# Patient Record
Sex: Female | Born: 1950 | Hispanic: Yes | Smoking: Former smoker
Health system: Southern US, Community
[De-identification: ages and names within clinical notes are randomized; demographics above are authoritative.]

## PROBLEM LIST (undated history)

## (undated) DIAGNOSIS — Z95810 Presence of automatic (implantable) cardiac defibrillator: Secondary | ICD-10-CM

## (undated) DIAGNOSIS — G4733 Obstructive sleep apnea (adult) (pediatric): Secondary | ICD-10-CM

## (undated) DIAGNOSIS — I10 Essential (primary) hypertension: Secondary | ICD-10-CM

## (undated) DIAGNOSIS — J449 Chronic obstructive pulmonary disease, unspecified: Secondary | ICD-10-CM

## (undated) DIAGNOSIS — I509 Heart failure, unspecified: Secondary | ICD-10-CM

## (undated) DIAGNOSIS — C801 Malignant (primary) neoplasm, unspecified: Secondary | ICD-10-CM

## (undated) DIAGNOSIS — Z950 Presence of cardiac pacemaker: Secondary | ICD-10-CM

## (undated) HISTORY — PX: ABDOMINAL HYSTERECTOMY: SHX81

## (undated) HISTORY — PX: TONSILLECTOMY: SUR1361

## (undated) HISTORY — PX: JOINT REPLACEMENT: SHX530

---

## 2011-09-08 DIAGNOSIS — C50919 Malignant neoplasm of unspecified site of unspecified female breast: Secondary | ICD-10-CM | POA: Diagnosis present

## 2011-10-25 ENCOUNTER — Ambulatory Visit: Payer: Self-pay

## 2012-08-17 DIAGNOSIS — E669 Obesity, unspecified: Secondary | ICD-10-CM | POA: Diagnosis present

## 2014-03-12 DIAGNOSIS — Z95810 Presence of automatic (implantable) cardiac defibrillator: Secondary | ICD-10-CM | POA: Diagnosis present

## 2017-07-22 ENCOUNTER — Encounter: Payer: Self-pay | Admitting: Emergency Medicine

## 2017-07-22 ENCOUNTER — Ambulatory Visit
Admission: EM | Admit: 2017-07-22 | Discharge: 2017-07-22 | Disposition: A | Discharge status: Home / Self Care | Payer: Medicare HMO | Attending: Family Medicine | Admitting: Family Medicine

## 2017-07-22 DIAGNOSIS — M5432 Sciatica, left side: Secondary | ICD-10-CM | POA: Diagnosis not present

## 2017-07-22 HISTORY — DX: Malignant (primary) neoplasm, unspecified: C80.1

## 2017-07-22 HISTORY — DX: Essential (primary) hypertension: I10

## 2017-07-22 HISTORY — DX: Presence of cardiac pacemaker: Z95.0

## 2017-07-22 HISTORY — DX: Presence of automatic (implantable) cardiac defibrillator: Z95.810

## 2017-07-22 MED ORDER — PREDNISONE 20 MG PO TABS: ORAL_TABLET | ORAL | 0 refills | Status: DC

## 2017-07-22 NOTE — ED Provider Notes (Signed)
[facility]    CSN: [id number] Arrival date & time: 07/22/17  1246     History   Chief Complaint Chief Complaint  Patient presents with  . Leg Pain    HPI Sophia Andrews is a 66 y.o. female.    Leg Pain  Location:  Buttock Time since incident:  3 weeks Injury: no   Buttock location:  L buttock Pain details:    Quality:  Aching   Radiates to:  L leg Chronicity:  Recurrent Dislocation: no   Foreign body present:  No foreign bodies Prior injury to area:  No Relieved by:  Nothing Ineffective treatments:  NSAIDs Associated symptoms: back pain (left lower back )   Associated symptoms: no decreased ROM, no fatigue, no fever, no itching, no muscle weakness, no neck pain, no numbness, no stiffness, no swelling and no tingling   Back Pain  Location:  Lumbar spine Quality:  Aching Radiates to:  L posterior upper leg Pain severity:  Moderate Pain is:  Same all the time Onset quality:  Sudden Duration:  3 weeks Timing:  Constant Progression:  Unchanged Chronicity:  Recurrent Context: not emotional stress, not falling, not jumping from heights, not lifting heavy objects, not MCA, not MVA, not occupational injury, not pedestrian accident, not physical stress, not recent illness, not recent injury and not twisting   Relieved by:  Nothing Ineffective treatments:  NSAIDs and narcotics Associated symptoms: leg pain   Associated symptoms: no abdominal pain, no abdominal swelling, no bladder incontinence, no bowel incontinence, no chest pain, no dysuria, no fever, no headaches, no numbness, no paresthesias, no pelvic pain, no perianal numbness, no tingling, no weakness and no weight loss   Risk factors: hx of cancer   Risk factors: not pregnant, no recent surgery and no steroid use     Past Medical History:  Diagnosis Date  . Cancer ([place])   . Cardiac defibrillator in place   . Hypertension   . Pacemaker     There are no active problems to display for this  patient.   No past surgical history on file.  OB History    No data available       Home Medications    Prior to Admission medications   Medication Sig Start Date End Date Taking? Authorizing Provider  allopurinol (ZYLOPRIM) 100 MG tablet Take 100 mg by mouth daily.   Yes [provider]  aspirin 81 MG chewable tablet Chew by mouth daily.   Yes [provider]  atorvastatin (LIPITOR) 40 MG tablet Take 40 mg by mouth daily.   Yes [provider]  diclofenac sodium (VOLTAREN) 1 % GEL Apply topically 4 (four) times daily.   Yes [provider]  DULoxetine (CYMBALTA) 60 MG capsule Take 60 mg by mouth daily.   Yes [provider]  furosemide (LASIX) 80 MG tablet Take 80 mg by mouth.   Yes [provider]  gabapentin (NEURONTIN) 400 MG capsule Take 400 mg by mouth 3 (three) times daily.   Yes [provider]  losartan (COZAAR) 25 MG tablet Take 25 mg by mouth daily.   Yes [provider]  magnesium oxide (MAG-OX) 400 MG tablet Take 400 mg by mouth daily.   Yes [provider]  metoprolol succinate (TOPROL-XL) 25 MG 24 hr tablet Take 25 mg by mouth daily.   Yes [provider]  Multiple Vitamin (MULTIVITAMIN) LIQD Take 5 mLs by mouth daily.   Yes [provider]  naphazoline-glycerin (CLEAR EYES) 0.012-0.2 % SOLN 1-2 drops 4 (four) times daily as needed for eye irritation.   Yes [provider]  spironolactone (ALDACTONE) 25 MG tablet Take 25 mg by mouth daily.   Yes [provider]  tamoxifen (NOLVADEX) 20 MG tablet Take 20 mg by mouth daily.   Yes [provider]  tiotropium (SPIRIVA) 18 MCG inhalation capsule Place 18 mcg into inhaler and inhale daily.   Yes [provider]  traMADol (ULTRAM) 50 MG tablet Take by mouth every 6 (six) hours as needed.   Yes [provider]  predniSONE (DELTASONE) 20 MG tablet 2 tabs po qd 07/22/17   Norval Gable, MD     Family History No family history on file.  Social History Social History  Substance Use Topics  . Smoking status: Never Smoker  . Smokeless tobacco: Never Used  . Alcohol use No     Allergies   Ambien [zolpidem tartrate]; Lyrica [pregabalin]; and Tetanus toxoids   Review of Systems Review of Systems  Constitutional: Negative for fatigue, fever and weight loss.  Cardiovascular: Negative for chest pain.  Gastrointestinal: Negative for abdominal pain and bowel incontinence.  Genitourinary: Negative for bladder incontinence, dysuria and pelvic pain.  Musculoskeletal: Positive for back pain (left lower back ). Negative for neck pain and stiffness.  Skin: Negative for itching.  Neurological: Negative for tingling, weakness, numbness, headaches and paresthesias.     Physical Exam Triage Vital Signs ED Triage Vitals  Enc Vitals Group     BP 07/22/17 1303 (!) 116/49     Pulse Rate 07/22/17 1303 88     Resp 07/22/17 1303 16     Temp 07/22/17 1303 98.2 F (36.8 C)     Temp Source 07/22/17 1303 Oral     SpO2 07/22/17 1303 96 %     Weight 07/22/17 1305 220 lb (99.8 kg)     Height 07/22/17 1305 5\' 5"  (1.651 m)     Head Circumference --      Peak Flow --      Pain Score 07/22/17 1306 8     Pain Loc --      Pain Edu? --      Excl. in [place]? --    No data found.   Updated Vital Signs BP (!) 116/49 (BP Location: Left Arm)   Pulse 88   Temp 98.2 F (36.8 C) (Oral)   Resp 16   Ht 5\' 5"  (1.651 m)   Wt 220 lb (99.8 kg)   SpO2 96%   BMI 36.61 kg/m   Visual Acuity Right Eye Distance:   Left Eye Distance:   Bilateral Distance:    Right Eye Near:   Left Eye Near:    Bilateral Near:     Physical Exam  Constitutional: She is oriented to person, place, and time. She appears well-developed and well-nourished. No distress.  HENT:  Head: Normocephalic and atraumatic.  Eyes: Pupils are equal, round, and reactive to light. EOM are normal.  Neck: Normal range of motion.  Neck supple. No tracheal deviation present. No thyromegaly present.  Musculoskeletal: She exhibits no edema.       Lumbar back: She exhibits tenderness (over the left lumbar paraspinous muscles and left buttock). She exhibits normal range of motion, no bony tenderness, no swelling, no edema, no deformity, no laceration, no pain, no spasm and normal pulse.  Lymphadenopathy:    She has no cervical adenopathy.  Neurological: She is alert and oriented to  person, place, and time. She has normal reflexes. No cranial nerve deficit. She exhibits normal muscle tone. Coordination normal.  Skin: She is not diaphoretic.  Nursing note and vitals reviewed.    UC Treatments / Results  Labs (all labs ordered are listed, but only abnormal results are displayed) Labs Reviewed - No data to display  EKG  EKG Interpretation None       Radiology No results found.  Procedures Procedures (including critical care time)  Medications Ordered in UC Medications - No data to display   Initial Impression / Assessment and Plan / UC Course  I have reviewed the triage vital signs and the nursing notes.  Pertinent labs & imaging results that were available during my care of the patient were reviewed by me and considered in my medical decision making (see chart for details).       Final Clinical Impressions(s) / UC Diagnoses   Final diagnoses:  Sciatica of left side    New Prescriptions Discharge Medication List as of 07/22/2017  1:43 PM    START taking these medications   Details  predniSONE (DELTASONE) 20 MG tablet 2 tabs po qd, Normal      1. diagnosis reviewed with patient 2. rx as per orders above; reviewed possible side effects, interactions, risks and benefits  3. Recommend supportive treatment with rest, ice/heat 4. Follow-up prn if symptoms worsen or don't improve Controlled Substance Prescriptions [place] Controlled Substance Registry consulted? Not Applicable   Norval Gable,  MD 07/22/17 1426

## 2017-07-22 NOTE — ED Triage Notes (Signed)
Pt reports "I Have Sciatic Nerve" reports pain radiating down left leg with difficulty walking, sitting, and standing ongoing over last 3 weeks. Pt denies recent fall or injury reports similar symptoms previously about 3 months ago

## 2019-08-15 ENCOUNTER — Inpatient Hospital Stay
Admission: EM | Admit: 2019-08-15 | Discharge: 2019-08-18 | DRG: 871 | Disposition: A | Discharge status: Home Health | Payer: Medicare HMO | Attending: Specialist | Admitting: Specialist

## 2019-08-15 ENCOUNTER — Other Ambulatory Visit: Payer: Self-pay

## 2019-08-15 ENCOUNTER — Encounter: Payer: Self-pay | Admitting: Emergency Medicine

## 2019-08-15 ENCOUNTER — Emergency Department: Payer: Medicare HMO

## 2019-08-15 DIAGNOSIS — Z7982 Long term (current) use of aspirin: Secondary | ICD-10-CM | POA: Diagnosis not present

## 2019-08-15 DIAGNOSIS — D649 Anemia, unspecified: Secondary | ICD-10-CM | POA: Diagnosis present

## 2019-08-15 DIAGNOSIS — Z887 Allergy status to serum and vaccine status: Secondary | ICD-10-CM | POA: Diagnosis not present

## 2019-08-15 DIAGNOSIS — Z888 Allergy status to other drugs, medicaments and biological substances status: Secondary | ICD-10-CM

## 2019-08-15 DIAGNOSIS — A419 Sepsis, unspecified organism: Secondary | ICD-10-CM | POA: Diagnosis present

## 2019-08-15 DIAGNOSIS — E785 Hyperlipidemia, unspecified: Secondary | ICD-10-CM | POA: Diagnosis present

## 2019-08-15 DIAGNOSIS — J441 Chronic obstructive pulmonary disease with (acute) exacerbation: Secondary | ICD-10-CM | POA: Diagnosis present

## 2019-08-15 DIAGNOSIS — I340 Nonrheumatic mitral (valve) insufficiency: Secondary | ICD-10-CM | POA: Diagnosis not present

## 2019-08-15 DIAGNOSIS — Z9660 Presence of unspecified orthopedic joint implant: Secondary | ICD-10-CM | POA: Diagnosis present

## 2019-08-15 DIAGNOSIS — W07XXXA Fall from chair, initial encounter: Secondary | ICD-10-CM | POA: Diagnosis present

## 2019-08-15 DIAGNOSIS — Z7952 Long term (current) use of systemic steroids: Secondary | ICD-10-CM

## 2019-08-15 DIAGNOSIS — G629 Polyneuropathy, unspecified: Secondary | ICD-10-CM | POA: Diagnosis present

## 2019-08-15 DIAGNOSIS — G4733 Obstructive sleep apnea (adult) (pediatric): Secondary | ICD-10-CM | POA: Diagnosis present

## 2019-08-15 DIAGNOSIS — E872 Acidosis: Secondary | ICD-10-CM | POA: Diagnosis present

## 2019-08-15 DIAGNOSIS — Z95810 Presence of automatic (implantable) cardiac defibrillator: Secondary | ICD-10-CM

## 2019-08-15 DIAGNOSIS — Z20828 Contact with and (suspected) exposure to other viral communicable diseases: Secondary | ICD-10-CM | POA: Diagnosis present

## 2019-08-15 DIAGNOSIS — W19XXXA Unspecified fall, initial encounter: Secondary | ICD-10-CM

## 2019-08-15 DIAGNOSIS — R6520 Severe sepsis without septic shock: Secondary | ICD-10-CM | POA: Diagnosis present

## 2019-08-15 DIAGNOSIS — N179 Acute kidney failure, unspecified: Secondary | ICD-10-CM | POA: Diagnosis present

## 2019-08-15 DIAGNOSIS — I5042 Chronic combined systolic (congestive) and diastolic (congestive) heart failure: Secondary | ICD-10-CM | POA: Diagnosis present

## 2019-08-15 DIAGNOSIS — I110 Hypertensive heart disease with heart failure: Secondary | ICD-10-CM | POA: Diagnosis present

## 2019-08-15 DIAGNOSIS — N390 Urinary tract infection, site not specified: Secondary | ICD-10-CM | POA: Diagnosis present

## 2019-08-15 DIAGNOSIS — Z853 Personal history of malignant neoplasm of breast: Secondary | ICD-10-CM

## 2019-08-15 DIAGNOSIS — R531 Weakness: Secondary | ICD-10-CM

## 2019-08-15 DIAGNOSIS — A4151 Sepsis due to Escherichia coli [E. coli]: Principal | ICD-10-CM | POA: Diagnosis present

## 2019-08-15 DIAGNOSIS — J449 Chronic obstructive pulmonary disease, unspecified: Secondary | ICD-10-CM | POA: Diagnosis present

## 2019-08-15 DIAGNOSIS — R0602 Shortness of breath: Secondary | ICD-10-CM | POA: Diagnosis present

## 2019-08-15 DIAGNOSIS — I5022 Chronic systolic (congestive) heart failure: Secondary | ICD-10-CM | POA: Diagnosis present

## 2019-08-15 DIAGNOSIS — J9601 Acute respiratory failure with hypoxia: Secondary | ICD-10-CM | POA: Diagnosis present

## 2019-08-15 DIAGNOSIS — I10 Essential (primary) hypertension: Secondary | ICD-10-CM | POA: Diagnosis present

## 2019-08-15 DIAGNOSIS — W182XXA Fall in (into) shower or empty bathtub, initial encounter: Secondary | ICD-10-CM | POA: Diagnosis present

## 2019-08-15 DIAGNOSIS — I509 Heart failure, unspecified: Secondary | ICD-10-CM

## 2019-08-15 DIAGNOSIS — R6521 Severe sepsis with septic shock: Secondary | ICD-10-CM | POA: Diagnosis not present

## 2019-08-15 HISTORY — DX: Obstructive sleep apnea (adult) (pediatric): G47.33

## 2019-08-15 HISTORY — DX: Heart failure, unspecified: I50.9

## 2019-08-15 HISTORY — DX: Chronic obstructive pulmonary disease, unspecified: J44.9

## 2019-08-15 LAB — URINALYSIS, ROUTINE W REFLEX MICROSCOPIC
Bilirubin Urine: NEGATIVE
Glucose, UA: NEGATIVE mg/dL
Ketones, ur: NEGATIVE mg/dL
Nitrite: POSITIVE — AB
Protein, ur: 100 mg/dL — AB
RBC / HPF: >50 RBC/hpf — ABNORMAL HIGH (ref 0–5)
Specific Gravity, Urine: 1.018 (ref 1.005–1.030)
WBC, UA: >50 WBC/hpf — ABNORMAL HIGH (ref 0–5)
pH: 5 (ref 5.0–8.0)

## 2019-08-15 LAB — COMPREHENSIVE METABOLIC PANEL
ALT: 28 U/L (ref 0–44)
AST: 39 U/L (ref 15–41)
Albumin: 3.3 g/dL — ABNORMAL LOW (ref 3.5–5.0)
Alkaline Phosphatase: 61 U/L (ref 38–126)
Anion gap: 11 (ref 5–15)
BUN: 29 mg/dL — ABNORMAL HIGH (ref 8–23)
CO2: 24 mmol/L (ref 22–32)
Calcium: 8.8 mg/dL — ABNORMAL LOW (ref 8.9–10.3)
Chloride: 106 mmol/L (ref 98–111)
Creatinine, Ser: 1.57 mg/dL — ABNORMAL HIGH (ref 0.44–1.00)
GFR calc Af Amer: 38 mL/min — ABNORMAL LOW (ref >60)
GFR calc non Af Amer: 33 mL/min — ABNORMAL LOW (ref >60)
Glucose, Bld: 100 mg/dL — ABNORMAL HIGH (ref 70–99)
Potassium: 4.8 mmol/L (ref 3.5–5.1)
Sodium: 141 mmol/L (ref 135–145)
Total Bilirubin: 0.8 mg/dL (ref 0.3–1.2)
Total Protein: 6.4 g/dL — ABNORMAL LOW (ref 6.5–8.1)

## 2019-08-15 LAB — CBC WITH DIFFERENTIAL/PLATELET
Abs Immature Granulocytes: 0.32 10*3/uL — ABNORMAL HIGH (ref 0.00–0.07)
Basophils Absolute: 0.1 10*3/uL (ref 0.0–0.1)
Basophils Relative: 0 %
Eosinophils Absolute: 0 10*3/uL (ref 0.0–0.5)
Eosinophils Relative: 0 %
HCT: 36.2 % (ref 36.0–46.0)
Hemoglobin: 11.5 g/dL — ABNORMAL LOW (ref 12.0–15.0)
Immature Granulocytes: 2 %
Lymphocytes Relative: 7 %
Lymphs Abs: 1.4 10*3/uL (ref 0.7–4.0)
MCH: 32.6 pg (ref 26.0–34.0)
MCHC: 31.8 g/dL (ref 30.0–36.0)
MCV: 102.5 fL — ABNORMAL HIGH (ref 80.0–100.0)
Monocytes Absolute: 1.3 10*3/uL — ABNORMAL HIGH (ref 0.1–1.0)
Monocytes Relative: 6 %
Neutro Abs: 18.3 10*3/uL — ABNORMAL HIGH (ref 1.7–7.7)
Neutrophils Relative %: 85 %
Platelets: 162 10*3/uL (ref 150–400)
RBC: 3.53 MIL/uL — ABNORMAL LOW (ref 3.87–5.11)
RDW: 15.5 % (ref 11.5–15.5)
WBC: 21.7 10*3/uL — ABNORMAL HIGH (ref 4.0–10.5)
nRBC: 0 % (ref 0.0–0.2)

## 2019-08-15 LAB — LACTIC ACID, PLASMA
Lactic Acid, Venous: 2.3 mmol/L (ref 0.5–1.9)
Lactic Acid, Venous: 2 mmol/L (ref 0.5–1.9)

## 2019-08-15 LAB — PROCALCITONIN: Procalcitonin: 33.33 ng/mL

## 2019-08-15 LAB — CK: Total CK: 125 U/L (ref 38–234)

## 2019-08-15 MED ORDER — METRONIDAZOLE IN NACL 5-0.79 MG/ML-% IV SOLN
500.0000 mg | Freq: Once | INTRAVENOUS | Status: AC
  Administered 2019-08-15: 500 mg via INTRAVENOUS
  Filled 2019-08-15: qty 100

## 2019-08-15 MED ORDER — SODIUM CHLORIDE 0.9 % IV BOLUS (SEPSIS)
800.0000 mL | Freq: Once | INTRAVENOUS | Status: AC
  Administered 2019-08-15: 800 mL via INTRAVENOUS

## 2019-08-15 MED ORDER — SODIUM CHLORIDE 0.9 % IV SOLN
2.0000 g | Freq: Once | INTRAVENOUS | Status: AC
  Administered 2019-08-15: 2 g via INTRAVENOUS
  Filled 2019-08-15: qty 2

## 2019-08-15 MED ORDER — SODIUM CHLORIDE 0.9 % IV BOLUS (SEPSIS)
1000.0000 mL | Freq: Once | INTRAVENOUS | Status: AC
  Administered 2019-08-15: 1000 mL via INTRAVENOUS

## 2019-08-15 MED ORDER — VANCOMYCIN HCL IN DEXTROSE 1-5 GM/200ML-% IV SOLN: 1000.0000 mg | Freq: Once | INTRAVENOUS | Status: DC

## 2019-08-15 MED ORDER — VANCOMYCIN HCL 10 G IV SOLR
2500.0000 mg | Freq: Once | INTRAVENOUS | Status: AC
  Administered 2019-08-15: 2500 mg via INTRAVENOUS
  Filled 2019-08-15: qty 2500

## 2019-08-15 MED ORDER — ACETAMINOPHEN 500 MG PO TABS
1000.0000 mg | ORAL_TABLET | Freq: Once | ORAL | Status: AC
  Administered 2019-08-15: 1000 mg via ORAL
  Filled 2019-08-15: qty 2

## 2019-08-15 NOTE — ED Provider Notes (Signed)
[facility] Emergency Department Provider Note  ____________________________________________   First MD Initiated Contact with Patient 08/15/19 1958     (approximate)  I have reviewed the triage vital signs and the nursing notes.  History  Chief Complaint Dizziness    HPI Sophia Andrews is a 68 y.o. female with history of hypertension, status post pacemaker, who presents to the emergency department for a fall.  Patient was in her shower chair earlier today when she fell out of the chair into the tub.  She states she fell because she slipped.  She denies any head injury or trauma.  She denies any pain related to the fall.  She was unable to get herself up because she felt so weak, and was in the tub for a large portion of the day until EMS arrived.  She reports feeling chills, generalized weakness, as well as some dizziness. These symptoms have been present for 1-2 days. She also reports some malodorous urine, dysuria, similar to prior UTIs.  She denies any difficulty breathing, cough, abdominal pain, nausea, vomiting.         Past Medical Hx Past Medical History:  Diagnosis Date  . Cancer ([place])   . Cardiac defibrillator in place   . Hypertension   . Pacemaker     Problem List There are no active problems to display for this patient.   Past Surgical Hx History reviewed. No pertinent surgical history.  Medications Prior to Admission medications   Medication Sig Start Date End Date Taking? Authorizing Provider  allopurinol (ZYLOPRIM) 100 MG tablet Take 100 mg by mouth daily.    [provider]  aspirin 81 MG chewable tablet Chew by mouth daily.    [provider]  atorvastatin (LIPITOR) 40 MG tablet Take 40 mg by mouth daily.    [provider]  diclofenac sodium (VOLTAREN) 1 % GEL Apply topically 4 (four) times daily.    [provider]  DULoxetine (CYMBALTA) 60 MG capsule Take 60 mg by mouth daily.     [provider]  furosemide (LASIX) 80 MG tablet Take 80 mg by mouth.    [provider]  gabapentin (NEURONTIN) 400 MG capsule Take 400 mg by mouth 3 (three) times daily.    [provider]  losartan (COZAAR) 25 MG tablet Take 25 mg by mouth daily.    [provider]  magnesium oxide (MAG-OX) 400 MG tablet Take 400 mg by mouth daily.    [provider]  metoprolol succinate (TOPROL-XL) 25 MG 24 hr tablet Take 25 mg by mouth daily.    [provider]  Multiple Vitamin (MULTIVITAMIN) LIQD Take 5 mLs by mouth daily.    [provider]  naphazoline-glycerin (CLEAR EYES) 0.012-0.2 % SOLN 1-2 drops 4 (four) times daily as needed for eye irritation.    [provider]  predniSONE (DELTASONE) 20 MG tablet 2 tabs po qd 07/22/17   Norval Gable, MD  spironolactone (ALDACTONE) 25 MG tablet Take 25 mg by mouth daily.    [provider]  tamoxifen (NOLVADEX) 20 MG tablet Take 20 mg by mouth daily.    [provider]  tiotropium (SPIRIVA) 18 MCG inhalation capsule Place 18 mcg into inhaler and inhale daily.    [provider]  traMADol (ULTRAM) 50 MG tablet Take by mouth every 6 (six) hours as needed.    [provider]    Allergies Ambien [zolpidem tartrate], Lyrica [pregabalin], and Tetanus toxoids  Family Hx History reviewed. No pertinent family history.  Social Hx Social History   Tobacco Use  . Smoking status: Never Smoker  . Smokeless tobacco: Never Used  Substance Use Topics  . Alcohol use: No  . Drug use: Not on file     Review of Systems  Constitutional: Negative for fever. + for chills and generalized weakness. Eyes: Negative for visual changes. ENT: Negative for sore throat. Cardiovascular: Negative for chest pain. Respiratory: Negative for shortness of breath. Gastrointestinal: Negative for abdominal pain. Negative for nausea. Negative for vomiting. Genitourinary: + for  dysuria. Musculoskeletal: Negative for leg swelling. Skin: Negative for rash. Neurological: Negative for for headaches. + dizziness   Physical Exam  Vital Signs: ED Triage Vitals  Enc Vitals Group     BP --      Pulse Rate 08/15/19 1949 (!) 114     Resp 08/15/19 1949 (!) 27     Temp 08/15/19 1949 (!) 101.1 F (38.4 C)     Temp Source 08/15/19 1949 Oral     SpO2 08/15/19 1948 93 %     Weight 08/15/19 1950 220 lb (99.8 kg)     Height 08/15/19 1950 5\' 4"  (1.626 m)     Head Circumference --      Peak Flow --      Pain Score 08/15/19 1950 0     Pain Loc --      Pain Edu? --      Excl. in [place]? --     Constitutional: Alert and oriented.  Eyes: Conjunctivae clear. Sclera anicteric. Head: Normocephalic. Atraumatic. Nose: No congestion. No rhinorrhea. Mouth/Throat: Mucous membranes are very dry.  Neck: No stridor.   Cardiovascular: Tachycardic. No murmurs. Extremities well perfused. Respiratory: Normal respiratory effort.  Lungs CTAB. Gastrointestinal: Soft and non-tender. No distention.  Old midline surgical scar well-healed. Musculoskeletal: No lower extremity edema.  No obvious trauma or deformities. Neurologic:  Normal speech and language. No gross focal neurologic deficits are appreciated.  Skin: Skin is warm, dry and intact.  Psychiatric: Mood and affect are appropriate for situation.  EKG  Personally reviewed.   Rate: 113 Rhythm: sinus Axis: leftward Intervals: WNL No acute ischemic changes No STEMI    Radiology  XR: IMPRESSION: 1. No acute cardiopulmonary abnormalities.  Procedures  Procedure(s) performed (including critical care):  .Critical Care Performed by: Lilia Pro., MD Authorized by: Lilia Pro., MD   Critical care provider statement:    Critical care time (minutes):  45   Critical care was necessary to treat or prevent imminent or life-threatening deterioration of the following conditions:  Sepsis   Critical care was time spent  personally by me on the following activities:  Evaluation of patient's response to treatment, examination of patient, ordering and performing treatments and interventions, ordering and review of laboratory studies, ordering and review of radiographic studies, pulse oximetry, re-evaluation of patient's condition, obtaining history from patient or surrogate and review of old charts   10:42 PM Sepsis - repeat assessment performed. HR improving to low 100s. Temperature improved. Good cap refill and mental status stable. Symptomatically feeling improved.   Initial Impression / Assessment and Plan / ED Course  68 y.o. female who presents to the ED for a fall, found to be febrile, tachycardic, concerning for sepsis.  Ddx: high suspicion UTI, pulmonary infection; given her fall with downtime, also concern for rhabdo.  She meets sepsis criteria, will therefore obtain labs, including cultures, administer fluids based on ideal body weight  and empiric antibiotics.  Work up consistent with urosepsis. Labs reveal leukocytosis to 21.7, significantly elevated procalcitonin, initial lactic acid 2.3 (will repeat after fluids).  She is receiving fluids and antibiotics. Discussed w/ hospitalist for admission.     Final Clinical Impression(s) / ED Diagnosis  Final diagnoses:  Fall, initial encounter  Sepsis, due to unspecified organism, unspecified whether acute organ dysfunction present [facility])  Generalized weakness  Urinary tract infection in elderly patient       Note:  This document was prepared using Dragon voice recognition software and may include unintentional dictation errors.   Lilia Pro., MD 08/15/19 2351

## 2019-08-15 NOTE — ED Triage Notes (Signed)
Pt arrived via [facility] from home. States that she fell out of her tub chair around 1100 and has been in the tub since about 1947 when EMS arrived. States that she is dizzy and feels cold from sitting in the tub.

## 2019-08-15 NOTE — ED Notes (Signed)
Date and time results received: 08/15/19 8:53 PM  Test: lactic acid Critical Value: 2.3  Name of Provider Notified: Dr. Joan Mayans  Orders Received? Or Actions Taken?: no new orders at this time

## 2019-08-15 NOTE — Progress Notes (Signed)
CODE SEPSIS - PHARMACY COMMUNICATION  **Broad Spectrum Antibiotics should be administered within 1 hour of Sepsis diagnosis**  Time Code Sepsis Called/Page Received: 8:21 pm   Antibiotics Ordered: Vancomycin/Cefepime/Metronidazole  Time of 1st antibiotic administration: @2042  cefepime  Additional action taken by pharmacy: N/A  If necessary, Name of Provider/Nurse Contacted: N/A   Rowland Lathe ,PharmD Clinical Pharmacist  08/15/2019  8:51 PM

## 2019-08-15 NOTE — H&P (Signed)
[facility] [place] NAME: Sophia Andrews    MR#:  [id number]  DATE OF BIRTH:  31-Aug-1951  DATE OF ADMISSION:  08/15/2019  PRIMARY CARE PHYSICIAN: Christen Bame, DO   REQUESTING/REFERRING PHYSICIAN: Joan Mayans, MD  CHIEF COMPLAINT:   Chief Complaint  Patient presents with  . Dizziness    HISTORY OF PRESENT ILLNESS:  Morning Sophia Andrews  is a 68 y.o. female who presents with chief complaint as above.  Patient presents the ED with a complaint of weakness and lightheadedness.  She states that this is been going on for a few days now.  She has a history of UTIs.  Evaluation here in the ED shows that she meets sepsis criteria, and has nitrites positive on UA indicating UTI.  Hospitalist were called for admission  PAST MEDICAL HISTORY:   Past Medical History:  Diagnosis Date  . Cancer ([place])   . Cardiac defibrillator in place   . CHF (congestive heart failure) ([place])   . COPD (chronic obstructive pulmonary disease) ([place])   . Hypertension   . OSA (obstructive sleep apnea)   . Pacemaker      PAST SURGICAL HISTORY:   Past Surgical History:  Procedure Laterality Date  . ABDOMINAL HYSTERECTOMY    . JOINT REPLACEMENT    . TONSILLECTOMY       SOCIAL HISTORY:   Social History   Tobacco Use  . Smoking status: Never Smoker  . Smokeless tobacco: Never Used  Substance Use Topics  . Alcohol use: No     FAMILY HISTORY:    Family history reviewed and is non-contributory DRUG ALLERGIES:   Allergies  Allergen Reactions  . Ambien [Zolpidem Tartrate]   . Lyrica [Pregabalin]   . Tetanus Toxoids     MEDICATIONS AT HOME:   Prior to Admission medications   Medication Sig Start Date End Date Taking? Authorizing Provider  albuterol (VENTOLIN HFA) 108 (90 Base) MCG/ACT inhaler Inhale 2 puffs into the lungs every 6 (six) hours as needed for shortness of breath or wheezing. 07/10/19  Yes [provider]  allopurinol  (ZYLOPRIM) 300 MG tablet Take 300 mg by mouth daily.    Yes [provider]  aspirin 81 MG EC tablet Take 81 mg by mouth daily.    Yes [provider]  atorvastatin (LIPITOR) 40 MG tablet Take 40 mg by mouth daily.   Yes [provider]  bisacodyl (DULCOLAX) 10 MG suppository Place 1 suppository rectally daily. 07/10/19  Yes [provider]  diclofenac sodium (VOLTAREN) 1 % GEL Apply 2 g topically 4 (four) times daily.    Yes [provider]  DULoxetine (CYMBALTA) 60 MG capsule Take 120 mg by mouth daily.    Yes [provider]  furosemide (LASIX) 20 MG tablet Take 20 mg by mouth.    Yes [provider]  gabapentin (NEURONTIN) 400 MG capsule Take 800 mg by mouth 3 (three) times daily.    Yes [provider]  HYDROcodone-acetaminophen (NORCO/VICODIN) 5-325 MG tablet Take 1 tablet by mouth 2 (two) times daily as needed for pain. 06/22/19  Yes [provider]  magnesium oxide (MAG-OX) 400 MG tablet Take 400 mg by mouth daily.   Yes [provider]  metoprolol succinate (TOPROL-XL) 50 MG 24 hr tablet Take 50 mg by mouth daily.    Yes [provider]  Multiple Vitamin (MULTIVITAMIN) LIQD Take 5 mLs by mouth daily.   Yes [provider]  naphazoline-glycerin (CLEAR EYES) 0.012-0.2 % SOLN 1-2 drops 4 (four) times daily as needed for eye irritation.   Yes [provider]  Polyethylene Glycol 3350 (PEG 3350) 17 GM/SCOOP POWD Take 17 g by mouth daily. 07/10/19  Yes [provider]  sacubitril-valsartan (ENTRESTO) 97-103 MG Take 1 tablet by mouth 2 (two) times daily. 12/11/18  Yes [provider]  senna (SENNA LAXATIVE) 8.6 MG tablet Take 1 tablet by mouth daily. 07/10/19  Yes [provider]  tamoxifen (NOLVADEX) 20 MG tablet Take 20 mg by mouth daily.   Yes [provider]  tiotropium (SPIRIVA) 18 MCG inhalation capsule Place 18 mcg into inhaler and inhale daily.    Yes [provider]  losartan (COZAAR) 25 MG tablet Take 25 mg by mouth daily.    [provider]  methylPREDNISolone (MEDROL DOSEPAK) 4 MG TBPK tablet follow package directions 07/26/19   [provider]  predniSONE (DELTASONE) 20 MG tablet 2 tabs po qd Patient not taking: Reported on 08/15/2019 07/22/17   Norval Gable, MD  spironolactone (ALDACTONE) 25 MG tablet Take 25 mg by mouth daily.    [provider]  traMADol (ULTRAM) 50 MG tablet Take 50 mg by mouth every 6 (six) hours as needed.     [provider]    REVIEW OF SYSTEMS:  Review of Systems  Constitutional: Positive for chills, fever and malaise/fatigue. Negative for weight loss.  HENT: Negative for ear pain, hearing loss and tinnitus.   Eyes: Negative for blurred vision, double vision, pain and redness.  Respiratory: Negative for cough, hemoptysis and shortness of breath.   Cardiovascular: Negative for chest pain, palpitations, orthopnea and leg swelling.  Gastrointestinal: Negative for abdominal pain, constipation, diarrhea, nausea and vomiting.  Genitourinary: Positive for dysuria. Negative for frequency and hematuria.  Musculoskeletal: Negative for back pain, joint pain and neck pain.  Skin:       No acne, rash, or lesions  Neurological: Positive for weakness. Negative for dizziness, tremors and focal weakness.  Endo/Heme/Allergies: Negative for polydipsia. Does not bruise/bleed easily.  Psychiatric/Behavioral: Negative for depression. The patient is not nervous/anxious and does not have insomnia.      VITAL SIGNS:   Vitals:   08/15/19 2230 08/15/19 2248 08/15/19 2249 08/15/19 2338  BP: (!) 99/46   109/85  Pulse: (!) 109 (!) 107  (!) 107  Resp: (!) 21 19  16   Temp:   99.9 F (37.7 C)   TempSrc:   Oral   SpO2: 92% 97%  98%  Weight:      Height:       Wt Readings from Last 3 Encounters:  08/15/19 99.8 kg  07/22/17 99.8 kg    PHYSICAL EXAMINATION:  Physical Exam   Vitals reviewed. Constitutional: She is oriented to person, place, and time. She appears well-developed and well-nourished. No distress.  HENT:  Head: Normocephalic and atraumatic.  Mouth/Throat: Oropharynx is clear and moist.  Eyes: Pupils are equal, round, and reactive to light. Conjunctivae and EOM are normal. No scleral icterus.  Neck: Normal range of motion. Neck supple. No JVD present. No thyromegaly present.  Cardiovascular: Regular rhythm and intact distal pulses. Exam reveals no gallop and no friction rub.  No murmur heard. Tachycardic  Respiratory: Effort normal and breath sounds normal. No respiratory distress. She has no wheezes. She has no rales.  GI: Soft. Bowel sounds are normal. She exhibits no distension. There is no abdominal tenderness.  Musculoskeletal: Normal range of motion.  General: No edema.     Comments: No arthritis, no gout  Lymphadenopathy:    She has no cervical adenopathy.  Neurological: She is alert and oriented to person, place, and time. No cranial nerve deficit.  No dysarthria, no aphasia  Skin: Skin is warm and dry. No rash noted. No erythema.  Psychiatric: She has a normal mood and affect. Her behavior is normal. Judgment and thought content normal.    LABORATORY PANEL:   CBC Recent Labs  Lab 08/15/19 2011  WBC 21.7*  HGB 11.5*  HCT 36.2  PLT 162   ------------------------------------------------------------------------------------------------------------------  Chemistries  Recent Labs  Lab 08/15/19 2011  NA 141  K 4.8  CL 106  CO2 24  GLUCOSE 100*  BUN 29*  CREATININE 1.57*  CALCIUM 8.8*  AST 39  ALT 28  ALKPHOS 61  BILITOT 0.8   ------------------------------------------------------------------------------------------------------------------  Cardiac Enzymes No results for input(s): TROPONINI in the last 168  hours. ------------------------------------------------------------------------------------------------------------------  RADIOLOGY:  Dg Chest Port 1 View  Result Date: 08/15/2019 CLINICAL DATA:  Fevers EXAM: PORTABLE CHEST 1 VIEW COMPARISON:  None FINDINGS: Left chest wall ICD is noted with lead in the right ventricle. There is a normal heart size. No pleural effusion or edema. No airspace opacities identified. Visualized osseous structures appear intact. IMPRESSION: 1. No acute cardiopulmonary abnormalities. Electronically Signed   By: Kerby Moors M.D.   On: 08/15/2019 20:36    EKG:   Orders placed or performed during the hospital encounter of 08/15/19  . EKG 12-Lead  . EKG 12-Lead  . ED EKG 12-Lead  . ED EKG 12-Lead    IMPRESSION AND PLAN:  Principal Problem:   Severe sepsis ([place]) -sepsis due to UTI.  Lactic acid was initially elevated, and came down with IV fluids.  Will continue to administer IV fluids cautiously given her history of heart failure and recheck lactic acid until within normal limits.  IV antibiotics given.  Cultures sent from the ED.  Blood pressures been stable. Active Problems:   UTI (urinary tract infection) -IV antibiotics and urine culture as above   AKI (acute kidney injury) ([place]) -likely due to UTI, IV fluids as above, avoid nephrotoxins and monitor for expected improvement   Chronic systolic CHF (congestive heart failure) ([facility]) -cautious administration of IV fluids as above, continue home meds   COPD (chronic obstructive pulmonary disease) ([place]) -home dose inhalers   HTN (hypertension) -home dose antihypertensives   OSA (obstructive sleep apnea) -CPAP nightly  Chart review performed and case discussed with ED provider. Labs, imaging and/or ECG reviewed by provider and discussed with patient/family. Management plans discussed with the patient and/or family.  COVID-19 status: Pending  DVT PROPHYLAXIS: Systemic anticoagulation  GI PROPHYLAXIS:   None  ADMISSION STATUS: Inpatient     CODE STATUS: Full  TOTAL TIME TAKING CARE OF THIS PATIENT: 45 minutes.   This patient was evaluated in the context of the global COVID-19 pandemic, which necessitated consideration that the patient might be at risk for infection with the SARS-CoV-2 virus that causes COVID-19. Institutional protocols and algorithms that pertain to the evaluation of patients at risk for COVID-19 are in a state of rapid change based on information released by regulatory bodies including the CDC and federal and state organizations. These policies and algorithms were followed to the best of this provider's knowledge to date during the patient's care at this facility.  Ethlyn Daniels 08/15/2019, 11:47 PM  [facility]  Office  [phone]  CC: Primary  care physician; Christen Bame, DO  Note:  This document was prepared using Dragon voice recognition software and may include unintentional dictation errors.

## 2019-08-15 NOTE — Consult Note (Signed)
PHARMACY -  BRIEF ANTIBIOTIC NOTE   Pharmacy has received consult(s) for Cefepime and Vancomycin from an ED provider.  The patient's profile has been reviewed for ht/wt/allergies/indication/available labs.    One time order(s) placed for Vancomycin 2500 mg x1 dose and Cefepime 2g x1 dose.   Further antibiotics/pharmacy consults should be ordered by admitting physician if indicated.                       Thank you, Rowland Lathe 08/15/2019  8:22 PM

## 2019-08-16 ENCOUNTER — Inpatient Hospital Stay: Admit: 2019-08-16 | Payer: Medicare HMO

## 2019-08-16 ENCOUNTER — Inpatient Hospital Stay (HOSPITAL_COMMUNITY)
Admit: 2019-08-16 | Discharge: 2019-08-16 | Disposition: A | Discharge status: Home / Self Care | Payer: Medicare HMO | Attending: Pulmonary Disease | Admitting: Pulmonary Disease

## 2019-08-16 ENCOUNTER — Inpatient Hospital Stay: Payer: Medicare HMO

## 2019-08-16 ENCOUNTER — Encounter: Payer: Self-pay | Admitting: Internal Medicine

## 2019-08-16 ENCOUNTER — Other Ambulatory Visit: Payer: Self-pay

## 2019-08-16 DIAGNOSIS — I340 Nonrheumatic mitral (valve) insufficiency: Secondary | ICD-10-CM

## 2019-08-16 DIAGNOSIS — A419 Sepsis, unspecified organism: Secondary | ICD-10-CM

## 2019-08-16 DIAGNOSIS — R6520 Severe sepsis without septic shock: Secondary | ICD-10-CM

## 2019-08-16 DIAGNOSIS — R6521 Severe sepsis with septic shock: Secondary | ICD-10-CM

## 2019-08-16 DIAGNOSIS — J441 Chronic obstructive pulmonary disease with (acute) exacerbation: Secondary | ICD-10-CM

## 2019-08-16 LAB — BLOOD GAS, ARTERIAL
Acid-base deficit: 2.8 mmol/L — ABNORMAL HIGH (ref 0.0–2.0)
Bicarbonate: 21.9 mmol/L (ref 20.0–28.0)
Delivery systems: POSITIVE
Expiratory PAP: 6
FIO2: 0.5
Inspiratory PAP: 12
O2 Saturation: 99.6 %
Patient temperature: 39.4
RATE: 12 resp/min
pCO2 arterial: 41 mmHg (ref 32.0–48.0)
pH, Arterial: 7.34 — ABNORMAL LOW (ref 7.350–7.450)
pO2, Arterial: 191 mmHg — ABNORMAL HIGH (ref 83.0–108.0)

## 2019-08-16 LAB — BLOOD CULTURE ID PANEL (REFLEXED)

## 2019-08-16 LAB — CBC
HCT: 31.9 % — ABNORMAL LOW (ref 36.0–46.0)
Hemoglobin: 10.2 g/dL — ABNORMAL LOW (ref 12.0–15.0)
MCH: 33 pg (ref 26.0–34.0)
MCHC: 32 g/dL (ref 30.0–36.0)
MCV: 103.2 fL — ABNORMAL HIGH (ref 80.0–100.0)
Platelets: 121 10*3/uL — ABNORMAL LOW (ref 150–400)
RBC: 3.09 MIL/uL — ABNORMAL LOW (ref 3.87–5.11)
RDW: 15.8 % — ABNORMAL HIGH (ref 11.5–15.5)
WBC: 11.9 10*3/uL — ABNORMAL HIGH (ref 4.0–10.5)
nRBC: 0 % (ref 0.0–0.2)

## 2019-08-16 LAB — BASIC METABOLIC PANEL
Anion gap: 6 (ref 5–15)
BUN: 31 mg/dL — ABNORMAL HIGH (ref 8–23)
CO2: 23 mmol/L (ref 22–32)
Calcium: 7.7 mg/dL — ABNORMAL LOW (ref 8.9–10.3)
Chloride: 110 mmol/L (ref 98–111)
Creatinine, Ser: 1.76 mg/dL — ABNORMAL HIGH (ref 0.44–1.00)
GFR calc Af Amer: 33 mL/min — ABNORMAL LOW (ref >60)
GFR calc non Af Amer: 29 mL/min — ABNORMAL LOW (ref >60)
Glucose, Bld: 97 mg/dL (ref 70–99)
Potassium: 4 mmol/L (ref 3.5–5.1)
Sodium: 139 mmol/L (ref 135–145)

## 2019-08-16 LAB — SARS CORONAVIRUS 2 BY RT PCR (HOSPITAL ORDER, PERFORMED IN ~~LOC~~ HOSPITAL LAB): SARS Coronavirus 2: NEGATIVE

## 2019-08-16 LAB — LACTIC ACID, PLASMA: Lactic Acid, Venous: 1.7 mmol/L (ref 0.5–1.9)

## 2019-08-16 LAB — GLUCOSE, CAPILLARY: Glucose-Capillary: 96 mg/dL (ref 70–99)

## 2019-08-16 LAB — MRSA PCR SCREENING: MRSA by PCR: NEGATIVE

## 2019-08-16 LAB — SARS CORONAVIRUS 2 (TAT 6-24 HRS): SARS Coronavirus 2: NEGATIVE

## 2019-08-16 MED ORDER — SODIUM CHLORIDE 0.9 % IV SOLN
INTRAVENOUS | Status: DC
  Administered 2019-08-16 (×2): via INTRAVENOUS

## 2019-08-16 MED ORDER — HYDROCORTISONE NA SUCCINATE PF 100 MG IJ SOLR
50.0000 mg | Freq: Two times a day (BID) | INTRAMUSCULAR | Status: DC
  Administered 2019-08-17: 50 mg via INTRAVENOUS
  Filled 2019-08-16 (×2): qty 1

## 2019-08-16 MED ORDER — SACUBITRIL-VALSARTAN 97-103 MG PO TABS
1.0000 | ORAL_TABLET | Freq: Two times a day (BID) | ORAL | Status: DC
  Filled 2019-08-16 (×2): qty 1

## 2019-08-16 MED ORDER — IPRATROPIUM-ALBUTEROL 0.5-2.5 (3) MG/3ML IN SOLN
3.0000 mL | RESPIRATORY_TRACT | Status: DC | PRN
  Administered 2019-08-16 (×2): 3 mL via RESPIRATORY_TRACT

## 2019-08-16 MED ORDER — METHYLPREDNISOLONE SODIUM SUCC 125 MG IJ SOLR
125.0000 mg | Freq: Once | INTRAMUSCULAR | Status: AC
  Administered 2019-08-16: 125 mg via INTRAVENOUS

## 2019-08-16 MED ORDER — KETOROLAC TROMETHAMINE 30 MG/ML IJ SOLN
30.0000 mg | Freq: Once | INTRAMUSCULAR | Status: AC
  Administered 2019-08-16: 30 mg via INTRAVENOUS
  Filled 2019-08-16: qty 1

## 2019-08-16 MED ORDER — GABAPENTIN 300 MG PO CAPS
800.0000 mg | ORAL_CAPSULE | Freq: Three times a day (TID) | ORAL | Status: DC
  Administered 2019-08-16 – 2019-08-18 (×7): 800 mg via ORAL
  Filled 2019-08-16 (×7): qty 2

## 2019-08-16 MED ORDER — CHLORHEXIDINE GLUCONATE CLOTH 2 % EX PADS
6.0000 | MEDICATED_PAD | Freq: Every day | CUTANEOUS | Status: DC
  Administered 2019-08-16: 6 via TOPICAL

## 2019-08-16 MED ORDER — ACETAMINOPHEN 650 MG RE SUPP: 650.0000 mg | Freq: Four times a day (QID) | RECTAL | Status: DC | PRN

## 2019-08-16 MED ORDER — HYDROCORTISONE NA SUCCINATE PF 100 MG IJ SOLR
50.0000 mg | Freq: Four times a day (QID) | INTRAMUSCULAR | Status: DC
  Administered 2019-08-16: 50 mg via INTRAVENOUS
  Filled 2019-08-16: qty 1
  Filled 2019-08-16: qty 2

## 2019-08-16 MED ORDER — SODIUM CHLORIDE 0.9 % IV BOLUS
250.0000 mL | Freq: Once | INTRAVENOUS | Status: AC
  Administered 2019-08-16 (×2): 250 mL via INTRAVENOUS

## 2019-08-16 MED ORDER — SODIUM CHLORIDE 0.9 % IV SOLN
1.0000 g | Freq: Two times a day (BID) | INTRAVENOUS | Status: DC
  Administered 2019-08-16 – 2019-08-18 (×5): 1 g via INTRAVENOUS
  Filled 2019-08-16 (×6): qty 1

## 2019-08-16 MED ORDER — TAMOXIFEN CITRATE 10 MG PO TABS
20.0000 mg | ORAL_TABLET | Freq: Every day | ORAL | Status: DC
  Administered 2019-08-16 – 2019-08-18 (×3): 20 mg via ORAL
  Filled 2019-08-16 (×4): qty 2

## 2019-08-16 MED ORDER — SODIUM CHLORIDE 0.9 % IV SOLN
2.0000 g | Freq: Two times a day (BID) | INTRAVENOUS | Status: DC
  Filled 2019-08-16: qty 2

## 2019-08-16 MED ORDER — ATORVASTATIN CALCIUM 20 MG PO TABS
40.0000 mg | ORAL_TABLET | Freq: Every day | ORAL | Status: DC
  Administered 2019-08-16 – 2019-08-17 (×2): 40 mg via ORAL
  Filled 2019-08-16 (×2): qty 2

## 2019-08-16 MED ORDER — BUDESONIDE 0.25 MG/2ML IN SUSP
0.2500 mg | Freq: Two times a day (BID) | RESPIRATORY_TRACT | Status: DC
  Administered 2019-08-16 – 2019-08-17 (×3): 0.25 mg via RESPIRATORY_TRACT
  Filled 2019-08-16 (×3): qty 2

## 2019-08-16 MED ORDER — VANCOMYCIN HCL 1.5 G IV SOLR: 1500.0000 mg | INTRAVENOUS | Status: DC

## 2019-08-16 MED ORDER — SODIUM CHLORIDE 0.9 % IV SOLN
0.0000 ug/min | INTRAVENOUS | Status: DC
  Administered 2019-08-16: 20 ug/min via INTRAVENOUS
  Filled 2019-08-16: qty 1

## 2019-08-16 MED ORDER — ASPIRIN EC 81 MG PO TBEC
81.0000 mg | DELAYED_RELEASE_TABLET | Freq: Every day | ORAL | Status: DC
  Administered 2019-08-16 – 2019-08-18 (×3): 81 mg via ORAL
  Filled 2019-08-16 (×3): qty 1

## 2019-08-16 MED ORDER — METHYLPREDNISOLONE SODIUM SUCC 125 MG IJ SOLR
INTRAMUSCULAR | Status: AC
  Filled 2019-08-16: qty 2

## 2019-08-16 MED ORDER — SODIUM CHLORIDE 0.9 % IV SOLN: INTRAVENOUS | Status: DC

## 2019-08-16 MED ORDER — ONDANSETRON HCL 4 MG/2ML IJ SOLN: 4.0000 mg | Freq: Four times a day (QID) | INTRAMUSCULAR | Status: DC | PRN

## 2019-08-16 MED ORDER — TIOTROPIUM BROMIDE MONOHYDRATE 18 MCG IN CAPS
18.0000 ug | ORAL_CAPSULE | Freq: Every day | RESPIRATORY_TRACT | Status: DC
  Administered 2019-08-16 – 2019-08-18 (×3): 18 ug via RESPIRATORY_TRACT
  Filled 2019-08-16: qty 5

## 2019-08-16 MED ORDER — METHYLPREDNISOLONE SODIUM SUCC 125 MG IJ SOLR
60.0000 mg | Freq: Four times a day (QID) | INTRAMUSCULAR | Status: DC
  Administered 2019-08-16: 60 mg via INTRAVENOUS
  Filled 2019-08-16: qty 2

## 2019-08-16 MED ORDER — ACETAMINOPHEN 325 MG PO TABS
650.0000 mg | ORAL_TABLET | Freq: Four times a day (QID) | ORAL | Status: DC | PRN
  Administered 2019-08-16: 650 mg via ORAL
  Filled 2019-08-16: qty 2

## 2019-08-16 MED ORDER — ONDANSETRON HCL 4 MG PO TABS: 4.0000 mg | ORAL_TABLET | Freq: Four times a day (QID) | ORAL | Status: DC | PRN

## 2019-08-16 MED ORDER — HEPARIN SODIUM (PORCINE) 5000 UNIT/ML IJ SOLN
5000.0000 [IU] | Freq: Three times a day (TID) | INTRAMUSCULAR | Status: DC
  Administered 2019-08-16 – 2019-08-17 (×6): 5000 [IU] via SUBCUTANEOUS
  Filled 2019-08-16 (×6): qty 1

## 2019-08-16 NOTE — ED Notes (Signed)
ED TO INPATIENT HANDOFF REPORT  ED Nurse Name and Phone #: Daiva Nakayama, [place] S Name/Age/Gender Sophia Andrews 68 y.o. female Room/Bed: ED26A/ED26A  Code Status   Code Status: Not on file  Home/SNF/Other Home Patient oriented to: self, place, time and situation Is this baseline? Yes   Triage Complete: Triage complete  Chief Complaint Weakness  Triage Note Pt arrived via [facility] from home. States that she fell out of her tub chair around 1100 and has been in the tub since about 1947 when EMS arrived. States that she is dizzy and feels cold from sitting in the tub.   Allergies Allergies  Allergen Reactions  . Ambien [Zolpidem Tartrate]   . Lyrica [Pregabalin]   . Tetanus Toxoids     Level of Care/Admitting Diagnosis ED Disposition    ED Disposition Condition [facility] Area: [place] [100120]  Level of Care: Med-Surg [16]  Covid Evaluation: Asymptomatic Screening Protocol (No Symptoms)  [facility]: [id number]  Admitting Physician: Lance Coon [id number]  Attending Physician: Lance Coon [phone]  Estimated length of stay: past midnight tomorrow  Certification:: I certify this patient will need inpatient services for at least 2 midnights  PT Class (Do Not Modify): Inpatient [101]  PT Acc Code (Do Not Modify): Private [1]       B Medical/Surgery History Past Medical History:  Diagnosis Date  . Cancer ([place])   . Cardiac defibrillator in place   . CHF (congestive heart failure) ([place])   . COPD (chronic obstructive pulmonary disease) ([place])   . Hypertension   . OSA (obstructive sleep apnea)   . Pacemaker    History reviewed. No pertinent surgical history.   A IV Location/Drains/Wounds Patient Lines/Drains/Airways Status   Active Line/Drains/Airways    Name:   Placement date:   Placement time:   Site:   Days:   Peripheral IV 08/15/19 Left Hand   08/15/19    -    Hand   1   Peripheral IV  08/15/19 Left Forearm   08/15/19    2012    Forearm   1          Intake/Output Last 24 hours  Intake/Output Summary (Last 24 hours) at 08/16/2019 0004 Last data filed at 08/15/2019 2112 Gross per 24 hour  Intake 100 ml  Output -  Net 100 ml    Labs/Imaging Results for orders placed or performed during the hospital encounter of 08/15/19 (from the past 48 hour(s))  Lactic acid, plasma     Status: Abnormal   Collection Time: 08/15/19  8:09 PM  Result Value Ref Range   Lactic Acid, Venous 2.3 (HH) 0.5 - 1.9 mmol/L    Comment: CRITICAL RESULT CALLED TO, READ BACK BY AND VERIFIED WITH ALICIA GRANGER ON [id number] AT [address] [facility] Performed at [facility], [address] [place], [place] [address]   Comprehensive metabolic panel     Status: Abnormal   Collection Time: 08/15/19  8:11 PM  Result Value Ref Range   Sodium 141 135 - 145 mmol/L   Potassium 4.8 3.5 - 5.1 mmol/L   Chloride 106 98 - 111 mmol/L   CO2 24 22 - 32 mmol/L   Glucose, Bld 100 (H) 70 - 99 mg/dL   BUN 29 (H) 8 - 23 mg/dL   Creatinine, Ser 1.57 (H) 0.44 - 1.00 mg/dL   Calcium 8.8 (L) 8.9 - 10.3 mg/dL   Total Protein 6.4 (L) 6.5 -  8.1 g/dL   Albumin 3.3 (L) 3.5 - 5.0 g/dL   AST 39 15 - 41 U/L   ALT 28 0 - 44 U/L   Alkaline Phosphatase 61 38 - 126 U/L   Total Bilirubin 0.8 0.3 - 1.2 mg/dL   GFR calc non Af Amer 33 (L) >60 mL/min   GFR calc Af Amer 38 (L) >60 mL/min   Anion gap 11 5 - 15    Comment: Performed at [facility], [address] [place], [place] [address]  CBC WITH DIFFERENTIAL     Status: Abnormal   Collection Time: 08/15/19  8:11 PM  Result Value Ref Range   WBC 21.7 (H) 4.0 - 10.5 K/uL   RBC 3.53 (L) 3.87 - 5.11 MIL/uL   Hemoglobin 11.5 (L) 12.0 - 15.0 g/dL   HCT 36.2 36.0 - 46.0 %   MCV 102.5 (H) 80.0 - 100.0 fL   MCH 32.6 26.0 - 34.0 pg   MCHC 31.8 30.0 - 36.0 g/dL   RDW 15.5 11.5 - 15.5 %   Platelets 162 150 - 400 K/uL   nRBC 0.0 0.0 - 0.2 %   Neutrophils Relative % 85 %    Neutro Abs 18.3 (H) 1.7 - 7.7 K/uL   Lymphocytes Relative 7 %   Lymphs Abs 1.4 0.7 - 4.0 K/uL   Monocytes Relative 6 %   Monocytes Absolute 1.3 (H) 0.1 - 1.0 K/uL   Eosinophils Relative 0 %   Eosinophils Absolute 0.0 0.0 - 0.5 K/uL   Basophils Relative 0 %   Basophils Absolute 0.1 0.0 - 0.1 K/uL   Immature Granulocytes 2 %   Abs Immature Granulocytes 0.32 (H) 0.00 - 0.07 K/uL    Comment: Performed at [facility], [address] [place], [place] [address]  Urinalysis, Routine w reflex microscopic     Status: Abnormal   Collection Time: 08/15/19  8:11 PM  Result Value Ref Range   Color, Urine YELLOW (A) YELLOW   APPearance CLOUDY (A) CLEAR   Specific Gravity, Urine 1.018 1.005 - 1.030   pH 5.0 5.0 - 8.0   Glucose, UA NEGATIVE NEGATIVE mg/dL   Hgb urine dipstick MODERATE (A) NEGATIVE   Bilirubin Urine NEGATIVE NEGATIVE   Ketones, ur NEGATIVE NEGATIVE mg/dL   Protein, ur 100 (A) NEGATIVE mg/dL   Nitrite POSITIVE (A) NEGATIVE   Leukocytes,Ua LARGE (A) NEGATIVE   RBC / HPF >50 (H) 0 - 5 RBC/hpf   WBC, UA >50 (H) 0 - 5 WBC/hpf   Bacteria, UA RARE (A) NONE SEEN   Squamous Epithelial / LPF 0-5 0 - 5   Mucus PRESENT     Comment: Performed at [facility], [address] [place], [place] [address]  Procalcitonin     Status: None   Collection Time: 08/15/19  8:11 PM  Result Value Ref Range   Procalcitonin 33.33 ng/mL    Comment:        Interpretation: PCT >= 10 ng/mL: Important systemic inflammatory response, almost exclusively due to severe bacterial sepsis or septic shock. (NOTE)       Sepsis PCT Algorithm           Lower Respiratory Tract                                      Infection PCT Algorithm    ----------------------------     ----------------------------  PCT < 0.25 ng/mL                PCT < 0.10 ng/mL         Strongly encourage             Strongly discourage   discontinuation of antibiotics    initiation of antibiotics     ----------------------------     -----------------------------       PCT 0.25 - 0.50 ng/mL            PCT 0.10 - 0.25 ng/mL               OR       >80% decrease in PCT            Discourage initiation of                                            antibiotics      Encourage discontinuation           of antibiotics    ----------------------------     -----------------------------         PCT >= 0.50 ng/mL              PCT 0.26 - 0.50 ng/mL                AND       <80% decrease in PCT             Encourage initiation of                                             antibiotics       Encourage continuation           of antibiotics    ----------------------------     -----------------------------        PCT >= 0.50 ng/mL                  PCT > 0.50 ng/mL               AND         increase in PCT                  Strongly encourage                                      initiation of antibiotics    Strongly encourage escalation           of antibiotics                                     -----------------------------                                           PCT <= 0.25 ng/mL  OR                                        > 80% decrease in PCT                                     Discontinue / Do not initiate                                             antibiotics Performed at [facility], [address] [place], [place] [address]   CK     Status: None   Collection Time: 08/15/19  8:11 PM  Result Value Ref Range   Total CK 125 38 - 234 U/L    Comment: Performed at [facility], [address] [place], [place] [address]  Lactic acid, plasma     Status: Abnormal   Collection Time: 08/15/19 10:53 PM  Result Value Ref Range   Lactic Acid, Venous 2.0 (HH) 0.5 - 1.9 mmol/L    Comment: CRITICAL VALUE NOTED. VALUE IS CONSISTENT WITH PREVIOUSLY REPORTED/CALLED VALUE [facility] Performed at [facility], [address]  [place], [place] [address]    Dg Chest [place] 1 View  Result Date: 08/15/2019 CLINICAL DATA:  Fevers EXAM: PORTABLE CHEST 1 VIEW COMPARISON:  None FINDINGS: Left chest wall ICD is noted with lead in the right ventricle. There is a normal heart size. No pleural effusion or edema. No airspace opacities identified. Visualized osseous structures appear intact. IMPRESSION: 1. No acute cardiopulmonary abnormalities. Electronically Signed   By: Kerby Moors M.D.   On: 08/15/2019 20:36    Pending Labs Unresulted Labs (From admission, onward)    Start     Ordered   08/15/19 2036  SARS CORONAVIRUS 2 (TAT 6-24 HRS) Nasopharyngeal Nasopharyngeal Swab  (Asymptomatic/Tier 2 Patients Labs)  ONCE - STAT,   STAT    Question Answer Comment  Is this test for diagnosis or screening Diagnosis of ill patient   Symptomatic for COVID-19 as defined by CDC Yes   Date of Symptom Onset 08/15/2019   Hospitalized for COVID-19 No   Admitted to ICU for COVID-19 No   Previously tested for COVID-19 No   Resident in a congregate (group) care setting No   Employed in healthcare setting No   Pregnant No      08/15/19 2035   08/15/19 2016  Blood Culture (routine x 2)  BLOOD CULTURE X 2,   STAT     08/15/19 2016   08/15/19 2016  Urine culture  ONCE - STAT,   STAT     08/15/19 2016   Signed and Held  Lactic acid, plasma  Once,   STAT     Signed and Held   Signed and Held  HIV antibody (Routine Testing)  Once,   R     Signed and Held   Signed and Held  CBC  (heparin)  Once,   R    Comments: Baseline for heparin therapy IF NOT ALREADY DRAWN.  Notify MD if PLT < 100 K.    Signed and Held   Signed and Held  Creatinine, serum  (heparin)  Once,   R  Comments: Baseline for heparin therapy IF NOT ALREADY DRAWN.    Signed and Held   Signed and Held  Basic metabolic panel  Tomorrow morning,   R     Signed and Held   Signed and Held  CBC  Tomorrow morning,   R     Signed and Held          Vitals/Pain Today's Vitals    08/15/19 2230 08/15/19 2248 08/15/19 2249 08/15/19 2338  BP: (!) 99/46   109/85  Pulse: (!) 109 (!) 107  (!) 107  Resp: (!) 21 19  16   Temp:   99.9 F (37.7 C)   TempSrc:   Oral   SpO2: 92% 97%  98%  Weight:      Height:      PainSc:        Isolation Precautions No active isolations  Medications Medications  sodium chloride 0.9 % bolus 1,000 mL (0 mLs Intravenous Stopped 08/15/19 2250)    And  sodium chloride 0.9 % bolus 800 mL (800 mLs Intravenous New Bag/Given 08/15/19 2251)  ceFEPIme (MAXIPIME) 2 g in sodium chloride 0.9 % 100 mL IVPB (0 g Intravenous Stopped 08/15/19 2112)  metroNIDAZOLE (FLAGYL) IVPB 500 mg (0 mg Intravenous Stopped 08/15/19 2250)  acetaminophen (TYLENOL) tablet 1,000 mg (1,000 mg Oral Given 08/15/19 2045)  vancomycin (VANCOCIN) 2,500 mg in sodium chloride 0.9 % 500 mL IVPB (0 mg Intravenous Stopped 08/15/19 2250)    Mobility walks Moderate fall risk   Focused Assessments     R Recommendations: See Admitting Provider Note  Report given to:   Additional Notes: None

## 2019-08-16 NOTE — Progress Notes (Signed)
Patient arrived to the unit tremoring, SOB, purple with O2 at 68-70% Tachy in the 170's and RR 38. Pt placed on a non-rebreather. Rapid Response called, charge nurse and ACX notified. O2 up to 100% Pulse in the 140's, RR 35, and Bp 136/62. MD Jannifer Franklin notified, New orders given, Pt to be transferred off unit. Will continue to monitor until transferred.

## 2019-08-16 NOTE — Progress Notes (Signed)
PHARMACY - PHYSICIAN COMMUNICATION CRITICAL VALUE ALERT - BLOOD CULTURE IDENTIFICATION (BCID)  Sophia Andrews is an 68 y.o. female who [facility] with a chief complaint of fever  Assessment:  3 of 4 bottles w/ E Coli, KPC not detected  Name of physician (or Provider) Contacted: Darel Hong, NP  Current antibiotics: Vancomycin and Cefepime  Changes to prescribed antibiotics recommended:  Recommendations accepted by provider:  Change to Meropenem 1gm IV q12hrs, renally adjusted.  Continue to monitor pt's clinical status  No results found for this or any previous visit.  Hart Robinsons A 08/16/2019  6:28 AM

## 2019-08-16 NOTE — Progress Notes (Addendum)
Pharmacy Antibiotic Note  Sophia Andrews is a 68 y.o. female admitted on 08/15/2019 with sepsis.  Pharmacy has been consulted for Cefepime and Vancomycin dosing.  Cefepime 2gm and Vancomycin 2500mg  given in ED on admission.  Plan: Cefepime 2gm IV q12hrs (renally adjusted)  Vancomycin 1500 mg IV Q 48 hrs. Goal AUC 400-550. Expected AUC: 531.1 SCr used: 1.57  Addendum:  ABX changed to Meropenem 1gm IV q12hrs (renally adjusted) due to BCx report of E Coli   Height: 5\' 4"  (162.6 cm) Weight: 233 lb 4 oz (105.8 kg) IBW/kg (Calculated) : 54.7  Temp (24hrs), Avg:100.8 F (38.2 C), Min:99.4 F (37.4 C), Max:103 F (39.4 C)  Recent Labs  Lab 08/15/19 2009 08/15/19 2011 08/15/19 2253  WBC  --  21.7*  --   CREATININE  --  1.57*  --   LATICACIDVEN 2.3*  --  2.0*    Estimated Creatinine Clearance: 39.5 mL/min (A) (by C-G formula based on SCr of 1.57 mg/dL (H)).    Allergies  Allergen Reactions  . Ambien [Zolpidem Tartrate]   . Lyrica [Pregabalin]   . Tetanus Toxoids     Antimicrobials this admission: Cefepime 9/16 >>  Vancomycin 9/16 >>  Flagyl 9/16 x 1 Meropenem 9/17 >>  Dose adjustments this admission:   Microbiology results:  BCx: E Coli  UCx:    Sputum:    MRSA PCR:   Thank you for allowing pharmacy to be a part of this patient's care.  Hart Robinsons A 08/16/2019 2:49 AM

## 2019-08-16 NOTE — Consult Note (Signed)
Name: Sophia Andrews MRN: [id number] DOB: 05/07/1951    ADMISSION DATE:  08/15/2019 CONSULTATION DATE:  08/16/2019  REFERRING MD : Dr. Jannifer Franklin  CHIEF COMPLAINT: Acute respiratory distress  BRIEF PATIENT DESCRIPTION:  68 year old female admitted to [facility] unit on 9/16 due to severe sepsis secondary to UTI.  On 9/17 she developed acute hypoxic respiratory failure (likely secondary to COPD Exacerbation and severe sepsis) requiring BiPAP.  COVID-19 PCR is pending.   SIGNIFICANT EVENTS  9/16>> admission to MedSurg unit 9/17>> acute respiratory distress requiring BiPAP  STUDIES:  N/A  CULTURES: SARS-CoV-2 PCR 9/16>> Blood x2 9/16>> Urine 9/16>> MRSA PCR 9/17>>  ANTIBIOTICS: Cefepime 9/16>>  Vancomycin 9/16>> Flagyl x1 dose 9/16  HISTORY OF PRESENT ILLNESS:   Sophia Andrews is a 68 year old female with a past medical history notable for combined systolic & diastolic CHF (EF 28% in 31/5176), COPD, OSA, hypertension, UTI, pacemaker who presented to [facility] ED on 08/15/2019 status post fall, and with complaints of weakness, lightheadedness, and chills.  She reports that she was in her shower and fell out of the chair into the tub because she slipped.  She denies syncope or any head injury or trauma.  She was so weak that she was unable to get out of the tub for a large portion of the day until EMS arrived. She also admits to malodorous urine and dysuria for approximately 1-2 days.  Initial work-up in the ED revealed WBC 21.7, procalcitonin 33.3, creatinine kinase 125, hemoglobin 11.5, creatinine 1.57, lactic acid 2.3.  Chest x-ray was negative, urinalysis is positive for UTI.  She met sepsis criteria, therefore she received IV fluids (1.8 L) and broad-spectrum antibiotics.  She was admitted to [place] unit for further work-up and treatment of severe sepsis secondary to UTI.  On 08/16/2019 she developed acute respiratory distress, hypoxia with O2 sats 68 to 70%, tachycardia with heart rate in the  170s.  Rapid response was called, and she was placed on nonrebreather.  She is being transferred to stepdown unit for further work-up and treatment of acute hypoxic respiratory failure requiring BiPAP and severe sepsis secondary to UTI.  PCCM is consulted for further management.  Upon arrival to ICU, she is tolerating BiPAP, and is alert and able to follow commands.  ABG post BiPAP is  7.34 / 41 / 191 / 21.9.  Repeat CXR without any acute cardiopulmonary abnormality.  Respiratory therapy reports wheezing upon auscultation.  Suspect her Acute Respiratory Distress is secondary to COPD Exacerbation along with sepsis.  Her COVID-19 PCR is pending.  PAST MEDICAL HISTORY :   has a past medical history of Cancer [facility]), Cardiac defibrillator in place, CHF (congestive heart failure) ([place]), COPD (chronic obstructive pulmonary disease) ([place]), Hypertension, OSA (obstructive sleep apnea), and Pacemaker.  has a past surgical history that includes Abdominal hysterectomy; Tonsillectomy; and Joint replacement. Prior to Admission medications   Medication Sig Start Date End Date Taking? Authorizing Provider  albuterol (VENTOLIN HFA) 108 (90 Base) MCG/ACT inhaler Inhale 2 puffs into the lungs every 6 (six) hours as needed for shortness of breath or wheezing. 07/10/19  Yes [provider]  allopurinol (ZYLOPRIM) 300 MG tablet Take 300 mg by mouth daily.    Yes [provider]  aspirin 81 MG EC tablet Take 81 mg by mouth daily.    Yes [provider]  atorvastatin (LIPITOR) 40 MG tablet Take 40 mg by mouth daily.   Yes [provider]  bisacodyl (DULCOLAX) 10 MG suppository Place 1 suppository  rectally daily. 07/10/19  Yes [provider]  diclofenac sodium (VOLTAREN) 1 % GEL Apply 2 g topically 4 (four) times daily.    Yes [provider]  DULoxetine (CYMBALTA) 60 MG capsule Take 120 mg by mouth daily.    Yes [provider]  furosemide (LASIX) 20 MG tablet  Take 20 mg by mouth.    Yes [provider]  gabapentin (NEURONTIN) 400 MG capsule Take 800 mg by mouth 3 (three) times daily.    Yes [provider]  HYDROcodone-acetaminophen (NORCO/VICODIN) 5-325 MG tablet Take 1 tablet by mouth 2 (two) times daily as needed for pain. 06/22/19  Yes [provider]  magnesium oxide (MAG-OX) 400 MG tablet Take 400 mg by mouth daily.   Yes [provider]  metoprolol succinate (TOPROL-XL) 50 MG 24 hr tablet Take 50 mg by mouth daily.    Yes [provider]  Multiple Vitamin (MULTIVITAMIN) LIQD Take 5 mLs by mouth daily.   Yes [provider]  naphazoline-glycerin (CLEAR EYES) 0.012-0.2 % SOLN 1-2 drops 4 (four) times daily as needed for eye irritation.   Yes [provider]  Polyethylene Glycol 3350 (PEG 3350) 17 GM/SCOOP POWD Take 17 g by mouth daily. 07/10/19  Yes [provider]  sacubitril-valsartan (ENTRESTO) 97-103 MG Take 1 tablet by mouth 2 (two) times daily. 12/11/18  Yes [provider]  senna (SENNA LAXATIVE) 8.6 MG tablet Take 1 tablet by mouth daily. 07/10/19  Yes [provider]  tamoxifen (NOLVADEX) 20 MG tablet Take 20 mg by mouth daily.   Yes [provider]  tiotropium (SPIRIVA) 18 MCG inhalation capsule Place 18 mcg into inhaler and inhale daily.   Yes [provider]  losartan (COZAAR) 25 MG tablet Take 25 mg by mouth daily.    [provider]  methylPREDNISolone (MEDROL DOSEPAK) 4 MG TBPK tablet follow package directions 07/26/19   [provider]  predniSONE (DELTASONE) 20 MG tablet 2 tabs po qd Patient not taking: Reported on 08/15/2019 07/22/17   Norval Gable, MD  spironolactone (ALDACTONE) 25 MG tablet Take 25 mg by mouth daily.    [provider]  traMADol (ULTRAM) 50 MG tablet Take 50 mg by mouth every 6 (six) hours as needed.     [provider]   Allergies  Allergen Reactions   Ambien [Zolpidem  Tartrate]    Lyrica [Pregabalin]    Tetanus Toxoids     FAMILY HISTORY:  family history is not on file. SOCIAL HISTORY:  reports that she has never smoked. She has never used smokeless tobacco. She reports that she does not drink alcohol.   COVID-19 DISASTER DECLARATION:  FULL CONTACT PHYSICAL EXAMINATION WAS NOT POSSIBLE DUE TO TREATMENT OF COVID-19 AND  CONSERVATION OF PERSONAL PROTECTIVE EQUIPMENT, LIMITED EXAM FINDINGS INCLUDE-  Patient assessed or the symptoms described in the history of present illness.  In the context of the Global COVID-19 pandemic, which necessitated consideration that the patient might be at risk for infection with the SARS-CoV-2 virus that causes COVID-19, Institutional protocols and algorithms that pertain to the evaluation of patients at risk for COVID-19 are in a state of rapid change based on information released by regulatory bodies including the CDC and federal and state organizations. These policies and algorithms were followed during the patient's care while in hospital.  REVIEW OF SYSTEMS: Positives in bold Constitutional: Negative for +fever, chills, weight loss, +malaise/fatigue and +diaphoresis.  HENT: Negative for hearing loss, ear pain, nosebleeds, congestion,  sore throat, neck pain, tinnitus and ear discharge.   Eyes: Negative for blurred vision, double vision, photophobia, pain, discharge and redness.  Respiratory: Negative for cough, hemoptysis, sputum production, +shortness of breath, wheezing and stridor.   Cardiovascular: Negative for chest pain, palpitations, orthopnea, claudication, leg swelling and PND.  Gastrointestinal: Negative for heartburn, nausea, vomiting, abdominal pain, diarrhea, constipation, blood in stool and melena.  Genitourinary: Negative for dysuria, urgency, frequency, hematuria and flank pain.  Musculoskeletal: Negative for myalgias, back pain, joint pain and falls.  Skin: Negative for itching and rash.    Neurological: Negative for dizziness, tingling, tremors, sensory change, speech change, focal weakness, seizures, loss of consciousness, weakness and headaches.  Endo/Heme/Allergies: Negative for environmental allergies and polydipsia. Does not bruise/bleed easily.  SUBJECTIVE:  Pt reports slight shortness of breath, which has greatly improved since being placed on BiPAP She reports generalized malaise, fever Denies chest pain, palpitations, dizziness, wheezing, cough Tolerating BiPAP  VITAL SIGNS: Temp:  [99.4 F (37.4 C)-101.1 F (38.4 C)] 100.5 F (38.1 C) (09/17 0101) Pulse Rate:  [105-149] 144 (09/17 0115) Resp:  [14-36] 32 (09/17 0115) BP: (99-141)/(39-103) 136/62 (09/17 0115) SpO2:  [90 %-100 %] 100 % (09/17 0115) Weight:  [99.8 kg] 99.8 kg (09/16 1950)  PHYSICAL EXAMINATION: General:  Acute on chronically ill appearing female, laying in bed, tolerating BiPAP, in no acute distress Neuro:  Sleeping, arouses to voice, follows commands, no focal deficits, unable to assess orientation due to BiPAP and PPE (PAPR) HEENT:  Atraumatic, normocephalic, neck supple, no JVD, Pupils PERRLA Cardiovascular:  Tachycardia, regular rhythm, 2+ pulses, negative homans sign, no redness or tenderness to BLE Lungs:  Unable to auscultate due to PPE (PAPR), BiPAP assisted, even, nonlabored Abdomen:  Obese, soft, nontender, nondistended, no guarding or rebound tenderness Musculoskeletal:  No deformities, generalized weakness Skin:  Warm, diaphoretic, no obvious rashes, lesions, or ulcerations  Recent Labs  Lab 08/15/19 2011  NA 141  K 4.8  CL 106  CO2 24  BUN 29*  CREATININE 1.57*  GLUCOSE 100*   Recent Labs  Lab 08/15/19 2011  HGB 11.5*  HCT 36.2  WBC 21.7*  PLT 162   Dg Chest Port 1 View  Result Date: 08/15/2019 CLINICAL DATA:  Fevers EXAM: PORTABLE CHEST 1 VIEW COMPARISON:  None FINDINGS: Left chest wall ICD is noted with lead in the right ventricle. There is a normal heart size.  No pleural effusion or edema. No airspace opacities identified. Visualized osseous structures appear intact. IMPRESSION: 1. No acute cardiopulmonary abnormalities. Electronically Signed   By: Kerby Moors M.D.   On: 08/15/2019 20:36    ASSESSMENT / PLAN:  Acute hypoxic respiratory failure likely secondary to COPD Exacerbation and severe sepsis Hx: COPD, OSA -Supplemental O2 as needed to maintain O2 sats 88 to 94% -BiPAP, wean as tolerated -Follow intermittent chest x-ray and ABG as needed -CXR 9/17 without PTX, pulmonary edema, pleural effusion, or consolidation -Bronchodilators -IV and nebulized Steroids -COVID-19 PCR pending  Severe sepsis secondary to UTI Lactic acidosis -Monitor fever curve -Trend WBCs and procalcitonin -Follow cultures as above -Continue cefepime and vancomycin -Trend lactic acid  Chronic combined Systolic & Diastolic CHF without acute exacerbation (EF 30 % in 09/2016) Hx: HTN, pacemaker -Continuous cardiac monitoring -Maintain map greater than 65 -Cautious IV fluids given CHF history -Neo-Synephrine if needed to maintain map goal -Obtain Echocardiogram  AKI -Monitor I&O's / urinary output -Follow BMP -Ensure adequate renal perfusion -Avoid nephrotoxic agents as able -Replace electrolytes as indicated -Gentle IV  Fluids  Anemia without s/sx of bleeding -Monitor for S/Sx of bleeding -Trend CBC -Heparin SQ for VTE Prophylaxis  -Transfuse for Hgb <7      Disposition: Stepdown Goals of care: Full code VTE prophylaxis: Heparin SQ Updates: Updated patient at bedside 08/16/2019  Darel Hong, [facility] Pager: [phone] Cell: [phone]  08/16/2019, 2:03 AM

## 2019-08-16 NOTE — Progress Notes (Signed)
[place] [facility] [place] NAME: Sophia Andrews    MR#:  [id number]  DATE OF BIRTH:  1951-04-08  SUBJECTIVE:   Patient presented to the hospital secondary to dizziness and sepsis criteria suspected to be secondary to UTI.  Her blood cultures are positive for E. coli.   Patient was admitted to the ICU as she was on vasopressors and also on BiPAP but now has been weaned off of both.  Awake and following commands and no complaints other than some mild shortness of breath.  REVIEW OF SYSTEMS:    Review of Systems  Constitutional: Negative for chills and fever.  HENT: Negative for congestion and tinnitus.   Eyes: Negative for blurred vision and double vision.  Respiratory: Positive for shortness of breath. Negative for cough and wheezing.   Cardiovascular: Negative for chest pain, orthopnea and PND.  Gastrointestinal: Negative for abdominal pain, diarrhea, nausea and vomiting.  Genitourinary: Negative for dysuria and hematuria.  Neurological: Positive for weakness (generalized. ). Negative for dizziness, sensory change and focal weakness.  All other systems reviewed and are negative.   Nutrition: Heart Healthy Tolerating Diet: Yes Tolerating PT: Await Eval.   DRUG ALLERGIES:   Allergies  Allergen Reactions  . Ambien [Zolpidem Tartrate]   . Lyrica [Pregabalin]   . Tetanus Toxoids     VITALS:  Blood pressure (!) 144/103, pulse (!) 103, temperature (!) 97.5 F (36.4 C), temperature source Oral, resp. rate 17, height 5\' 5"  (1.651 m), weight 106.8 kg, SpO2 100 %.  PHYSICAL EXAMINATION:   Physical Exam  GENERAL:  68 y.o.-year-old patient lying in bed in NAD.  EYES: Pupils equal, round, reactive to light and accommodation. No scleral icterus. Extraocular muscles intact.  HEENT: Head atraumatic, normocephalic. Oropharynx and nasopharynx clear.  NECK:  Supple, no jugular venous distention. No thyroid enlargement, no tenderness.  LUNGS: Normal  breath sounds bilaterally, no wheezing, rales, rhonchi. No use of accessory muscles of respiration.  CARDIOVASCULAR: S1, S2 normal. No murmurs, rubs, or gallops.  ABDOMEN: Soft, nontender, nondistended. Bowel sounds present. No organomegaly or mass.  EXTREMITIES: No cyanosis, clubbing or edema b/l.    NEUROLOGIC: Cranial nerves II through XII are intact. No focal Motor or sensory deficits b/l. Globally weak.   PSYCHIATRIC: The patient is alert and oriented x 3.  SKIN: No obvious rash, lesion, or ulcer.    LABORATORY PANEL:   CBC Recent Labs  Lab 08/16/19 0330  WBC 11.9*  HGB 10.2*  HCT 31.9*  PLT 121*   ------------------------------------------------------------------------------------------------------------------  Chemistries  Recent Labs  Lab 08/15/19 2011 08/16/19 0330  NA 141 139  K 4.8 4.0  CL 106 110  CO2 24 23  GLUCOSE 100* 97  BUN 29* 31*  CREATININE 1.57* 1.76*  CALCIUM 8.8* 7.7*  AST 39  --   ALT 28  --   ALKPHOS 61  --   BILITOT 0.8  --    ------------------------------------------------------------------------------------------------------------------  Cardiac Enzymes No results for input(s): TROPONINI in the last 168 hours. ------------------------------------------------------------------------------------------------------------------  RADIOLOGY:  Dg Chest Port 1 View  Result Date: 08/16/2019 CLINICAL DATA:  68 year old female congestive heart failure. EXAM: PORTABLE CHEST 1 VIEW COMPARISON:  08/15/2019 portable chest and earlier. FINDINGS: Portable AP upright view at 0147 hours. Stable lung volumes. Stable cardiac size and mediastinal contours. Stable left chest cardiac AICD. Visualized tracheal air column is within normal limits. Stable ventilation. No pneumothorax, pulmonary edema, pleural effusion or consolidation. Paucity of bowel gas in  the upper abdomen. IMPRESSION: Stable since yesterday.  No acute cardiopulmonary abnormality. Electronically  Signed   By: Genevie Ann M.D.   On: 08/16/2019 02:12   Dg Chest Port 1 View  Result Date: 08/15/2019 CLINICAL DATA:  Fevers EXAM: PORTABLE CHEST 1 VIEW COMPARISON:  None FINDINGS: Left chest wall ICD is noted with lead in the right ventricle. There is a normal heart size. No pleural effusion or edema. No airspace opacities identified. Visualized osseous structures appear intact. IMPRESSION: 1. No acute cardiopulmonary abnormalities. Electronically Signed   By: Kerby Moors M.D.   On: 08/15/2019 20:36     ASSESSMENT AND PLAN:   68 year old female with past medical history of obstructive sleep apnea, COPD, CHF, hypertension, status post AICD who presented to the hospital due to dizziness, weakness and noted to have sepsis secondary to UTI.  1.  Sepsis secondary to UTI- urinalysis was positive, admitted to the hospital initially and placed on broad-spectrum IV antibiotics with vancomycin, cefepime and Flagyl. - Patient's blood cultures are positive for E. coli.  Antibiotics narrowed to just IV meropenem. -Presently afebrile and hemodynamically stable.  2.  Urinary tract infection-source of patient's sepsis. - Urine and blood cultures are positive for E. coli.  Continue meropenem.  Await sensitivities.  3.  Acute respiratory failure with hypoxia-secondary to COPD with COPD exacerbation. - Patient was initially on BiPAP but now weaned off of it. -Currently on IV steroids but will taper -Continue bronchodilators, Pulmicort nebs.  4.  Hyperlipidemia-continue Lipitor.  5.  Neuropathy-continue gabapentin.  6.  History of breast cancer-continue tamoxifen.  All the records are reviewed and case discussed with Care Management/Social Worker. Management plans discussed with the patient, family and they are in agreement.  CODE STATUS: Full code  DVT Prophylaxis: Hep. SQ  TOTAL TIME TAKING CARE OF THIS PATIENT: 30 minutes.   POSSIBLE D/C IN 2-3 DAYS, DEPENDING ON CLINICAL CONDITION.   Henreitta Leber M.D on 08/16/2019 at 3:29 PM  Between 7am to 6pm - Pager - [phone]  After 6pm go to [email or web address] - [employer] [facility]  Office  [phone]  CC: Primary care physician; Christen Bame, DO

## 2019-08-16 NOTE — Progress Notes (Signed)
Transferred to room 108 via bed.Report given to [facility]

## 2019-08-17 LAB — BASIC METABOLIC PANEL
Anion gap: 7 (ref 5–15)
BUN: 39 mg/dL — ABNORMAL HIGH (ref 8–23)
CO2: 20 mmol/L — ABNORMAL LOW (ref 22–32)
Calcium: 7.5 mg/dL — ABNORMAL LOW (ref 8.9–10.3)
Chloride: 113 mmol/L — ABNORMAL HIGH (ref 98–111)
Creatinine, Ser: 1.43 mg/dL — ABNORMAL HIGH (ref 0.44–1.00)
GFR calc Af Amer: 43 mL/min — ABNORMAL LOW (ref >60)
GFR calc non Af Amer: 37 mL/min — ABNORMAL LOW (ref >60)
Glucose, Bld: 145 mg/dL — ABNORMAL HIGH (ref 70–99)
Potassium: 4.3 mmol/L (ref 3.5–5.1)
Sodium: 140 mmol/L (ref 135–145)

## 2019-08-17 LAB — CBC WITH DIFFERENTIAL/PLATELET
Abs Immature Granulocytes: 0.48 10*3/uL — ABNORMAL HIGH (ref 0.00–0.07)
Basophils Absolute: 0 10*3/uL (ref 0.0–0.1)
Basophils Relative: 0 %
Eosinophils Absolute: 0.1 10*3/uL (ref 0.0–0.5)
Eosinophils Relative: 1 %
HCT: 33.1 % — ABNORMAL LOW (ref 36.0–46.0)
Hemoglobin: 10.5 g/dL — ABNORMAL LOW (ref 12.0–15.0)
Immature Granulocytes: 3 %
Lymphocytes Relative: 7 %
Lymphs Abs: 1.4 10*3/uL (ref 0.7–4.0)
MCH: 32.3 pg (ref 26.0–34.0)
MCHC: 31.7 g/dL (ref 30.0–36.0)
MCV: 101.8 fL — ABNORMAL HIGH (ref 80.0–100.0)
Monocytes Absolute: 1 10*3/uL (ref 0.1–1.0)
Monocytes Relative: 5 %
Neutro Abs: 16.2 10*3/uL — ABNORMAL HIGH (ref 1.7–7.7)
Neutrophils Relative %: 84 %
Platelets: 140 10*3/uL — ABNORMAL LOW (ref 150–400)
RBC: 3.25 MIL/uL — ABNORMAL LOW (ref 3.87–5.11)
RDW: 15.6 % — ABNORMAL HIGH (ref 11.5–15.5)
Smear Review: NORMAL
WBC: 19.2 10*3/uL — ABNORMAL HIGH (ref 4.0–10.5)
nRBC: 0.1 % (ref 0.0–0.2)

## 2019-08-17 LAB — HIV ANTIBODY (ROUTINE TESTING W REFLEX): HIV Screen 4th Generation wRfx: NONREACTIVE

## 2019-08-17 LAB — ECHOCARDIOGRAM COMPLETE
Height: 65 in
Weight: 3767.22 oz

## 2019-08-17 LAB — PROCALCITONIN: Procalcitonin: <0.1 ng/mL

## 2019-08-17 MED ORDER — TRAZODONE HCL 50 MG PO TABS
50.0000 mg | ORAL_TABLET | Freq: Every evening | ORAL | Status: DC | PRN
  Administered 2019-08-17: 50 mg via ORAL
  Filled 2019-08-17: qty 1

## 2019-08-17 MED ORDER — HYDROCORTISONE NA SUCCINATE PF 100 MG IJ SOLR
50.0000 mg | Freq: Every day | INTRAMUSCULAR | Status: DC
  Administered 2019-08-18: 50 mg via INTRAVENOUS
  Filled 2019-08-17: qty 1

## 2019-08-17 MED ORDER — SODIUM CHLORIDE 0.9 % IV SOLN
INTRAVENOUS | Status: DC | PRN
  Administered 2019-08-17: 1000 mL via INTRAVENOUS
  Administered 2019-08-18: 09:00:00 250 mL via INTRAVENOUS

## 2019-08-17 NOTE — Progress Notes (Signed)
Pharmacy Antibiotic Note  Sophia Andrews is a 68 y.o. female admitted on 08/15/2019 with sepsis.  Pharmacy has been consulted for Meropenemdosing.    Patient was on Cefepime/Vancomycin and Blood cx with GNR likley E.coli , changed to Meropenem  Plan: Meropenem 1gm q12h (renally adjusted)  Vancomycin 1500 mg IV Q 48 hrs. Goal AUC 400-550. Expected AUC: 531.1 SCr used: 1.57     Height: 5\' 5"  (165.1 cm) Weight: 235 lb 7.2 oz (106.8 kg) IBW/kg (Calculated) : 57  Temp (24hrs), Avg:97.9 F (36.6 C), Min:97.5 F (36.4 C), Max:98.3 F (36.8 C)  Recent Labs  Lab 08/15/19 2009 08/15/19 2011 08/15/19 2253 08/16/19 0330 08/17/19 0620  WBC  --  21.7*  --  11.9* 19.2*  CREATININE  --  1.57*  --  1.76* 1.43*  LATICACIDVEN 2.3*  --  2.0* 1.7  --     Estimated Creatinine Clearance: 44.4 mL/min (A) (by C-G formula based on SCr of 1.43 mg/dL (H)).    Allergies  Allergen Reactions  . Ambien [Zolpidem Tartrate]   . Lyrica [Pregabalin]   . Tetanus Toxoids     Antimicrobials this admission: Cefepime 9/16 >> 9/17 Vancomycin 9/16 >> 9/17 Flagyl 9/16 x 1 Meropenem 9/17 >>  Dose adjustments this admission:   Microbiology results:  BCx: E Coli  UCx:    Sputum:    MRSA PCR:   Thank you for allowing pharmacy to be a part of this patient's care.  Jeffrie Stander A 08/17/2019 2:09 PM

## 2019-08-17 NOTE — Evaluation (Signed)
Physical Therapy Evaluation Patient Details Name: Sophia Andrews MRN: [id number] DOB: October 21, 1951 Today's Date: 08/17/2019   History of Present Illness  68 year old female with past medical history of obstructive sleep apnea, COPD, CHF, hypertension, status post AICD who presented to the hospital due to dizziness, weakness and noted to have sepsis secondary to UTI.  Clinical Impression  Pt reports she is feeling much better today but is still weak and having occasional tremoring (R UE < L, only minimally noted during PT Exam).  Pt reports that she does not have an AD and typically can walk dog around the block and do medium distance ambulation w/o issue.  Pt had a fall off shower bench and was stuck in the tub for 6+ hours prior to coming in.  She reports some fatigue with the effort of 200 ft of ambulation but did have HR increase to 130s (O2 low 90s) pt utilized the FWW for most of the effort and does endorse feeling safer and more confident cuing it (though she historically dislikes FWW and may look into getting her own 4WW).  PT suggested HHPT and pt agrees with this plan, encouraged her do some minimal walking with nursing while in the hospital.      Follow Up Recommendations Home health PT    Equipment Recommendations  (pt not interested in [facility], may get her own [phone])    Recommendations for Other Services       Precautions / Restrictions Precautions Precautions: Fall Restrictions Weight Bearing Restrictions: No      Mobility  Bed Mobility Overal bed mobility: Independent             General bed mobility comments: Pt is able to get herself to EOB and don sweatpants w/o issue, did need some assist with donning R sock  Transfers Overall transfer level: Independent Equipment used: None             General transfer comment: Pt was able to rise w/o AD and maintain static standing  Ambulation/Gait Ambulation/Gait assistance: Supervision Gait Distance (Feet): 200  Feet Assistive device: Rolling walker (2 wheeled);None       General Gait Details: Pt walked a majority of the loop using FWW, however she did do a 25 ft stretch w/o UEs and though slighly slower had good balance and safety.  Pt's O2 dropped from high 90s to low 90s with the effort and HR increased from 90s to 130s by the end of the effort.  Overall pt very weak but did not complain of excessive fatigue despite some dyspnea.  Stairs            Wheelchair Mobility    Modified Rankin (Stroke Patients Only)       Balance Overall balance assessment: Modified Independent                                           Pertinent Vitals/Pain Pain Assessment: No/denies pain    Home Living Family/patient expects to be discharged to:: Private residence Living Arrangements: Children Available Help at Discharge: Family;Available PRN/intermittently(son at work during the days) Type of Home: Apartment Home Access: Level entry     Home Layout: One level Home Equipment: Shower seat      Prior Function Level of Independence: Independent         Comments: Pt walks her dog around the block QD, goes grocery  shopping, etc     Hand Dominance        Extremity/Trunk Assessment   Upper Extremity Assessment Upper Extremity Assessment: Generalized weakness(= bilaterally, grossly 4-/5 b/l)    Lower Extremity Assessment Lower Extremity Assessment: Overall WFL for tasks assessed(good strength in b/l LEs)       Communication   Communication: No difficulties  Cognition Arousal/Alertness: Awake/alert Behavior During Therapy: WFL for tasks assessed/performed Overall Cognitive Status: Within Functional Limits for tasks assessed                                        General Comments      Exercises     Assessment/Plan    PT Assessment Patient needs continued PT services  PT Problem List Decreased strength;Decreased range of motion;Decreased  activity tolerance;Decreased mobility;Decreased balance;Decreased coordination;Decreased knowledge of use of DME;Decreased safety awareness       PT Treatment Interventions Gait training;Functional mobility training;Therapeutic activities;Therapeutic exercise;Balance training;DME instruction;Neuromuscular re-education;Patient/family education    PT Goals (Current goals can be found in the Care Plan section)  Acute Rehab PT Goals Patient Stated Goal: get back to walking her dog PT Goal Formulation: With patient Time For Goal Achievement: 08/31/19 Potential to Achieve Goals: Good    Frequency Min 2X/week   Barriers to discharge        Co-evaluation               AM-PAC PT "6 Clicks" Mobility  Outcome Measure Help needed turning from your back to your side while in a flat bed without using bedrails?: None Help needed moving from lying on your back to sitting on the side of a flat bed without using bedrails?: None Help needed moving to and from a bed to a chair (including a wheelchair)?: None Help needed standing up from a chair using your arms (e.g., wheelchair or bedside chair)?: None Help needed to walk in hospital room?: None Help needed climbing 3-5 steps with a railing? : A Little 6 Click Score: 23    End of Session Equipment Utilized During Treatment: Gait belt Activity Tolerance: Patient tolerated treatment well;Patient limited by fatigue Patient left: with chair alarm set;with call bell/phone within reach Nurse Communication: Mobility status(vitals) PT Visit Diagnosis: Difficulty in walking, not elsewhere classified (R26.2);Unsteadiness on feet (R26.81)    Time: [id number] PT Time Calculation (min) (ACUTE ONLY): 28 min   Charges:   PT Evaluation $PT Eval Low Complexity: 1 Low PT Treatments $Gait Training: 8-22 mins        Kreg Shropshire, DPT 08/17/2019, 1:19 PM

## 2019-08-17 NOTE — TOC Progression Note (Signed)
Transition of Care [facility]) - Progression Note    Patient Details  Name: Sophia Andrews MRN: [id number] Date of Birth: 1951-11-21  [facility]) CM/SW [place], RN Phone Number: 08/17/2019, 2:46 PM  Clinical Narrative:     Spoke with the patient in the room and discussed DCplan and needs She lives with her son and he provides transportation, She would benefit from [facility] PT and would like to use [facility] I notified Helene Kelp with [facility] The patient does not want DME, She doesn't like the walkers and does not need a BSC she says. No additional needs       Expected Discharge Plan and Services           Expected Discharge Date: 08/18/19                                     Social Determinants of Health (SDOH) Interventions    Readmission Risk Interventions No flowsheet data found.

## 2019-08-17 NOTE — Progress Notes (Signed)
[facility] NAME: Sophia Andrews    MR#:  [id number]  DATE OF BIRTH:  [birth year]  SUBJECTIVE:   Patient overall feels much better today.  Still complaining of some generalized weakness.  No abdominal pain nausea or vomiting.  REVIEW OF SYSTEMS:    Review of Systems  Constitutional: Negative for chills and fever.  HENT: Negative for congestion and tinnitus.   Eyes: Negative for blurred vision and double vision.  Respiratory: Negative for cough, shortness of breath and wheezing.   Cardiovascular: Negative for chest pain, orthopnea and PND.  Gastrointestinal: Negative for abdominal pain, diarrhea, nausea and vomiting.  Genitourinary: Negative for dysuria and hematuria.  Neurological: Positive for weakness (generalized. ). Negative for dizziness, sensory change and focal weakness.  All other systems reviewed and are negative.   Nutrition: Heart Healthy Tolerating Diet: Yes Tolerating PT: Await Eval.   DRUG ALLERGIES:   Allergies  Allergen Reactions  . Ambien [Zolpidem Tartrate]   . Lyrica [Pregabalin]   . Tetanus Toxoids     VITALS:  Blood pressure 107/61, pulse (!) 107, temperature 98.3 F (36.8 C), temperature source Oral, resp. rate 18, height 5\' 5"  (1.651 m), weight 106.8 kg, SpO2 95 %.  PHYSICAL EXAMINATION:   Physical Exam  GENERAL:  68 y.o.-year-old patient lying in bed in NAD.  EYES: Pupils equal, round, reactive to light and accommodation. No scleral icterus. Extraocular muscles intact.  HEENT: Head atraumatic, normocephalic. Oropharynx and nasopharynx clear.  NECK:  Supple, no jugular venous distention. No thyroid enlargement, no tenderness.  LUNGS: Normal breath sounds bilaterally, no wheezing, rales, rhonchi. No use of accessory muscles of respiration.  CARDIOVASCULAR: S1, S2 normal. No murmurs, rubs, or gallops.  ABDOMEN: Soft, nontender, nondistended. Bowel sounds present. No organomegaly or mass.   EXTREMITIES: No cyanosis, clubbing or edema b/l.    NEUROLOGIC: Cranial nerves II through XII are intact. No focal Motor or sensory deficits b/l. Globally weak.   PSYCHIATRIC: The patient is alert and oriented x 3.  SKIN: No obvious rash, lesion, or ulcer.    LABORATORY PANEL:   CBC Recent Labs  Lab 08/17/19 0620  WBC 19.2*  HGB 10.5*  HCT 33.1*  PLT 140*   ------------------------------------------------------------------------------------------------------------------  Chemistries  Recent Labs  Lab 08/15/19 2011  08/17/19 0620  NA 141   < > 140  K 4.8   < > 4.3  CL 106   < > 113*  CO2 24   < > 20*  GLUCOSE 100*   < > 145*  BUN 29*   < > 39*  CREATININE 1.57*   < > 1.43*  CALCIUM 8.8*   < > 7.5*  AST 39  --   --   ALT 28  --   --   ALKPHOS 61  --   --   BILITOT 0.8  --   --    < > = values in this interval not displayed.   ------------------------------------------------------------------------------------------------------------------  Cardiac Enzymes No results for input(s): TROPONINI in the last 168 hours. ------------------------------------------------------------------------------------------------------------------  RADIOLOGY:  Dg Chest Port 1 View  Result Date: 08/16/2019 CLINICAL DATA:  68 year old female congestive heart failure. EXAM: PORTABLE CHEST 1 VIEW COMPARISON:  08/15/2019 portable chest and earlier. FINDINGS: Portable AP upright view at 0147 hours. Stable lung volumes. Stable cardiac size and mediastinal contours. Stable left chest cardiac AICD. Visualized tracheal air column is within normal limits. Stable ventilation. No pneumothorax, pulmonary edema, pleural effusion or  consolidation. Paucity of bowel gas in the upper abdomen. IMPRESSION: Stable since yesterday.  No acute cardiopulmonary abnormality. Electronically Signed   By: Genevie Ann M.D.   On: 08/16/2019 02:12   Dg Chest Port 1 View  Result Date: 08/15/2019 CLINICAL DATA:  Fevers EXAM:  PORTABLE CHEST 1 VIEW COMPARISON:  None FINDINGS: Left chest wall ICD is noted with lead in the right ventricle. There is a normal heart size. No pleural effusion or edema. No airspace opacities identified. Visualized osseous structures appear intact. IMPRESSION: 1. No acute cardiopulmonary abnormalities. Electronically Signed   By: Kerby Moors M.D.   On: 08/15/2019 20:36     ASSESSMENT AND PLAN:   68 year old female with past medical history of obstructive sleep apnea, COPD, CHF, hypertension, status post AICD who presented to the hospital due to dizziness, weakness and noted to have sepsis secondary to UTI.  1.  Sepsis secondary to UTI- urinalysis was positive, admitted to the hospital initially and placed on broad-spectrum IV antibiotics with vancomycin, cefepime and Flagyl. - Patient's blood cultures are positive for E. coli.  Antibiotics narrowed to just IV meropenem. -Sensitivities on the blood and urine culture still pending.  Clinically much improved and afebrile and hemodynamically stable now.  2.  Urinary tract infection-source of patient's sepsis. - Urine and blood cultures are positive for E. coli.  Continue meropenem.  Await sensitivities.  3.  Acute respiratory failure with hypoxia-secondary to COPD with COPD exacerbation. - Patient was initially on BiPAP but now weaned off of it. -Currently on IV steroids and cont. To taper.  Will switch to Oral Pred in a.m.  - cont. Nebs PRN and d/c Pulmicort nebs as much improved.   4.  Hyperlipidemia-continue Lipitor.  5.  Neuropathy-continue gabapentin.  6.  History of breast cancer-continue tamoxifen.  Await PT eval.   All the records are reviewed and case discussed with Care Management/Social Worker. Management plans discussed with the patient, family and they are in agreement.  CODE STATUS: Full code  DVT Prophylaxis: Hep. SQ  TOTAL TIME TAKING CARE OF THIS PATIENT: 30 minutes.   POSSIBLE D/C IN 1-2 DAYS, DEPENDING ON  CLINICAL CONDITION.   Henreitta Leber M.D on 08/17/2019 at 12:50 PM  Between 7am to 6pm - Pager - [phone]  After 6pm go to [email or web address] - Technical brewer  Hospitalists  Office  [phone]  CC: Primary care physician; Christen Bame, DO

## 2019-08-18 LAB — CULTURE, BLOOD (ROUTINE X 2): Special Requests: ADEQUATE

## 2019-08-18 LAB — BASIC METABOLIC PANEL
Anion gap: 6 (ref 5–15)
BUN: 38 mg/dL — ABNORMAL HIGH (ref 8–23)
CO2: 22 mmol/L (ref 22–32)
Calcium: 7.9 mg/dL — ABNORMAL LOW (ref 8.9–10.3)
Chloride: 112 mmol/L — ABNORMAL HIGH (ref 98–111)
Creatinine, Ser: 1.3 mg/dL — ABNORMAL HIGH (ref 0.44–1.00)
GFR calc Af Amer: 48 mL/min — ABNORMAL LOW (ref >60)
GFR calc non Af Amer: 42 mL/min — ABNORMAL LOW (ref >60)
Glucose, Bld: 88 mg/dL (ref 70–99)
Potassium: 4.2 mmol/L (ref 3.5–5.1)
Sodium: 140 mmol/L (ref 135–145)

## 2019-08-18 LAB — CBC
HCT: 31.8 % — ABNORMAL LOW (ref 36.0–46.0)
Hemoglobin: 10.2 g/dL — ABNORMAL LOW (ref 12.0–15.0)
MCH: 32.6 pg (ref 26.0–34.0)
MCHC: 32.1 g/dL (ref 30.0–36.0)
MCV: 101.6 fL — ABNORMAL HIGH (ref 80.0–100.0)
Platelets: 157 10*3/uL (ref 150–400)
RBC: 3.13 MIL/uL — ABNORMAL LOW (ref 3.87–5.11)
RDW: 15.6 % — ABNORMAL HIGH (ref 11.5–15.5)
WBC: 17.6 10*3/uL — ABNORMAL HIGH (ref 4.0–10.5)
nRBC: 0.2 % (ref 0.0–0.2)

## 2019-08-18 LAB — URINE CULTURE: Culture: 70000 — AB

## 2019-08-18 LAB — PROCALCITONIN: Procalcitonin: 23.64 ng/mL

## 2019-08-18 MED ORDER — CEFUROXIME AXETIL 250 MG PO TABS: 250.0000 mg | ORAL_TABLET | Freq: Two times a day (BID) | ORAL | 0 refills | Status: AC

## 2019-08-18 NOTE — TOC Transition Note (Signed)
Transition of Care [facility]) - CM/SW Discharge Note   Patient Details  Name: Sophia Andrews MRN: [id number] Date of Birth: 08-Aug-1951  Transition of Care [facility]) CM/SW Contact:  Shelbie Hutching, RN Phone Number: 08/18/2019, 10:13 AM   Clinical Narrative:    Patient is ready for discharge today.  Patient reports that she is from home and lives with her son.  Patient will discharge home with home health services provided by [facility].  Drue Novel with [facility] aware of discharge.  Patient reports that she needs no DME.  Patient's son will provide transportation home.   Final next level of care: Home w Home Health Services Barriers to Discharge: Barriers Resolved   Patient Goals and CMS Choice   CMS Medicare.gov Compare Post Acute Care list provided to:: Patient Choice offered to / list presented to : Patient  Discharge Placement                       Discharge Plan and Services   Discharge Planning Services: CM Consult Post Acute Care Choice: Home Health                    HH Arranged: RN, PT [facility] Agency: [facility] (now [facility]) Date [place]: 08/18/19 Time [place]: 1012 Representative spoke with at [place]: [place] (SDOH) Interventions     Readmission Risk Interventions No flowsheet data found.

## 2019-08-18 NOTE — Progress Notes (Signed)
MD order received in [facility] to discharge pt home today with home health; TOC previously established home health services with [facility], no DME; verbally reviewed AVS with pt, gave Rx to pt; no questions voiced at this time; pt discharged via wheelchair by nursing to the medical mall entrance

## 2019-08-18 NOTE — Discharge Instructions (Signed)
[place]

## 2019-08-18 NOTE — Discharge Summary (Signed)
Sophia Andrews NAME: Sophia Andrews    MR#:  [id number]  DATE OF BIRTH:  [birth year]  DATE OF ADMISSION:  08/15/2019 ADMITTING PHYSICIAN: Lance Coon, MD  DATE OF DISCHARGE: 08/18/2019  PRIMARY CARE PHYSICIAN: Christen Bame, DO    ADMISSION DIAGNOSIS:  Generalized weakness [R53.1] Fall, initial encounter [W19.XXXA] Urinary tract infection in elderly patient [N39.0] Sepsis, due to unspecified organism, unspecified whether acute organ dysfunction present ([place]) [A41.9]  DISCHARGE DIAGNOSIS:  Principal Problem:   Severe sepsis ([place]) Active Problems:   UTI (urinary tract infection)   Chronic systolic CHF (congestive heart failure) (HCC)   AKI (acute kidney injury) ([place])   COPD (chronic obstructive pulmonary disease) (HCC)   HTN (hypertension)   OSA (obstructive sleep apnea)   SECONDARY DIAGNOSIS:   Past Medical History:  Diagnosis Date  . Cancer ([place])   . Cardiac defibrillator in place   . CHF (congestive heart failure) ([place])   . COPD (chronic obstructive pulmonary disease) ([place])   . Hypertension   . OSA (obstructive sleep apnea)   . Pacemaker     HOSPITAL COURSE:   68 year old female with past medical history of obstructive sleep apnea, COPD, CHF, hypertension, status post AICD who presented to the hospital due to dizziness, weakness and noted to have sepsis secondary to UTI.  1.  Sepsis secondary to UTI- urinalysis was positive, admitted to the hospital initially and placed on broad-spectrum IV antibiotics with vancomycin, cefepime and Flagyl. -Source of patient's sepsis was UTI.  Patient's urine cultures and blood cultures are positive for E. coli. -Antibiotics were then narrowed down to just IV meropenem and sensitivities came back as not being ESBL and therefore patient is now being discharged on oral Ceftin.  She is afebrile and hemodynamically stable and feels much better. -Patient will finish a total of 2  weeks of treatment given her bacteremia.  2.  Urinary tract infection-source of patient's sepsis. - Urine and blood cultures are positive for E. coli.    Initially treated with IV meropenem and now being discharged on oral Ceftin.  Urine cultures are positive but it is not ESBL.  3.  Acute respiratory failure with hypoxia-secondary to COPD with COPD exacerbation. - Patient was initially on BiPAP but now weaned off of it. -Initially on IV steroids, Pulmicort nebs and scheduled duo nebs and has significantly improved.  Now being discharged and will continue her maintenance inhalers including albuterol and Spiriva as stated below.  4.  Hyperlipidemia- pt. Will continue Lipitor.  5.  Neuropathy- Pt. Will continue gabapentin.  6.  History of breast cancer- pt. Will continue tamoxifen.  DISCHARGE CONDITIONS:   Stable  CONSULTS OBTAINED:    DRUG ALLERGIES:   Allergies  Allergen Reactions  . Ambien [Zolpidem Tartrate]   . Lyrica [Pregabalin]   . Tetanus Toxoids     DISCHARGE MEDICATIONS:   Allergies as of 08/18/2019      Reactions   Ambien [zolpidem Tartrate]    Lyrica [pregabalin]    Tetanus Toxoids       Medication List    STOP taking these medications   losartan 25 MG tablet Commonly known as: COZAAR   methylPREDNISolone 4 MG Tbpk tablet Commonly known as: MEDROL DOSEPAK   predniSONE 20 MG tablet Commonly known as: DELTASONE     TAKE these medications   allopurinol 300 MG tablet Commonly known as: ZYLOPRIM Take 300 mg by mouth daily.   aspirin 81 MG EC tablet  Take 81 mg by mouth daily.   atorvastatin 40 MG tablet Commonly known as: LIPITOR Take 40 mg by mouth daily.   bisacodyl 10 MG suppository Commonly known as: DULCOLAX Place 1 suppository rectally daily.   cefUROXime 250 MG tablet Commonly known as: CEFTIN Take 1 tablet (250 mg total) by mouth 2 (two) times daily with a meal for 10 days.   diclofenac sodium 1 % Gel Commonly known as:  VOLTAREN Apply 2 g topically 4 (four) times daily.   DULoxetine 60 MG capsule Commonly known as: CYMBALTA Take 120 mg by mouth daily.   furosemide 20 MG tablet Commonly known as: LASIX Take 20 mg by mouth.   gabapentin 400 MG capsule Commonly known as: NEURONTIN Take 800 mg by mouth 3 (three) times daily.   HYDROcodone-acetaminophen 5-325 MG tablet Commonly known as: NORCO/VICODIN Take 1 tablet by mouth 2 (two) times daily as needed for pain.   magnesium oxide 400 MG tablet Commonly known as: MAG-OX Take 400 mg by mouth daily.   metoprolol succinate 50 MG 24 hr tablet Commonly known as: TOPROL-XL Take 50 mg by mouth daily.   multivitamin Liqd Take 5 mLs by mouth daily.   naphazoline-glycerin 0.012-0.2 % Soln Commonly known as: CLEAR EYES REDNESS 1-2 drops 4 (four) times daily as needed for eye irritation.   PEG 3350 17 GM/SCOOP Powd Take 17 g by mouth daily.   sacubitril-valsartan 97-103 MG Commonly known as: ENTRESTO Take 1 tablet by mouth 2 (two) times daily.   Senna Laxative 8.6 MG tablet Generic drug: senna Take 1 tablet by mouth daily.   spironolactone 25 MG tablet Commonly known as: ALDACTONE Take 25 mg by mouth daily.   tamoxifen 20 MG tablet Commonly known as: NOLVADEX Take 20 mg by mouth daily.   tiotropium 18 MCG inhalation capsule Commonly known as: SPIRIVA Place 18 mcg into inhaler and inhale daily.   traMADol 50 MG tablet Commonly known as: ULTRAM Take 50 mg by mouth every 6 (six) hours as needed.   Ventolin HFA 108 (90 Base) MCG/ACT inhaler Generic drug: albuterol Inhale 2 puffs into the lungs every 6 (six) hours as needed for shortness of breath or wheezing.         DISCHARGE INSTRUCTIONS:   DIET:  Cardiac diet  DISCHARGE CONDITION:  Stable  ACTIVITY:  Activity as tolerated  OXYGEN:  Home Oxygen: No.   Oxygen Delivery: room air  DISCHARGE LOCATION:  Home with Home Health PT, RN.    If you experience worsening of  your admission symptoms, develop shortness of breath, life threatening emergency, suicidal or homicidal thoughts you must seek medical attention immediately by calling 911 or calling your MD immediately  if symptoms less severe.  You Must read complete instructions/literature along with all the possible adverse reactions/side effects for all the Medicines you take and that have been prescribed to you. Take any new Medicines after you have completely understood and accpet all the possible adverse reactions/side effects.   Please note  You were cared for by a hospitalist during your hospital stay. If you have any questions about your discharge medications or the care you received while you were in the hospital after you are discharged, you can call the unit and asked to speak with the hospitalist on call if the hospitalist that took care of you is not available. Once you are discharged, your primary care physician will handle any further medical issues. Please note that NO REFILLS for any discharge medications will  be authorized once you are discharged, as it is imperative that you return to your primary care physician (or establish a relationship with a primary care physician if you do not have one) for your aftercare needs so that they can reassess your need for medications and monitor your lab values.     Today   Patient feels a lot better today, ambulated with physical therapy and the recommend home health services.  Afebrile, hemodynamically stable.  Blood and urine cultures sensitivities are back and it is not ESBL.  Will discharge on oral antibiotics today.  VITAL SIGNS:  Blood pressure 113/63, pulse (!) 105, temperature 98.4 F (36.9 C), temperature source Oral, resp. rate 16, height 5\' 5"  (1.651 m), weight 107 kg, SpO2 94 %.  I/O:    Intake/Output Summary (Last 24 hours) at 08/18/2019 1117 Last data filed at 08/18/2019 0948 Gross per 24 hour  Intake 450.4 ml  Output -  Net 450.4 ml     PHYSICAL EXAMINATION:   GENERAL:  68 y.o.-year-old patient lying in bed in NAD.  EYES: Pupils equal, round, reactive to light and accommodation. No scleral icterus. Extraocular muscles intact.  HEENT: Head atraumatic, normocephalic. Oropharynx and nasopharynx clear.  NECK:  Supple, no jugular venous distention. No thyroid enlargement, no tenderness.  LUNGS: Normal breath sounds bilaterally, no wheezing, rales, rhonchi. No use of accessory muscles of respiration.  CARDIOVASCULAR: S1, S2 normal. No murmurs, rubs, or gallops.  ABDOMEN: Soft, nontender, nondistended. Bowel sounds present. No organomegaly or mass.  EXTREMITIES: No cyanosis, clubbing or edema b/l.    NEUROLOGIC: Cranial nerves II through XII are intact. No focal Motor or sensory deficits b/l.  PSYCHIATRIC: The patient is alert and oriented x 3.  SKIN: No obvious rash, lesion, or ulcer.   DATA REVIEW:   CBC Recent Labs  Lab 08/18/19 0516  WBC 17.6*  HGB 10.2*  HCT 31.8*  PLT 157    Chemistries  Recent Labs  Lab 08/15/19 2011  08/18/19 0516  NA 141   < > 140  K 4.8   < > 4.2  CL 106   < > 112*  CO2 24   < > 22  GLUCOSE 100*   < > 88  BUN 29*   < > 38*  CREATININE 1.57*   < > 1.30*  CALCIUM 8.8*   < > 7.9*  AST 39  --   --   ALT 28  --   --   ALKPHOS 61  --   --   BILITOT 0.8  --   --    < > = values in this interval not displayed.    Cardiac Enzymes No results for input(s): TROPONINI in the last 168 hours.  Microbiology Results  Results for orders placed or performed during the hospital encounter of 08/15/19  Blood Culture (routine x 2)     Status: Abnormal   Collection Time: 08/15/19  8:09 PM   Specimen: BLOOD  Result Value Ref Range Status   Specimen Description   Final    BLOOD BLOOD LEFT HAND Performed at [facility], [address] [place], [place] [address]    Special Requests   Final    BOTTLES DRAWN AEROBIC AND ANAEROBIC Blood Culture results may not be optimal due to an  excessive volume of blood received in culture bottles Performed at [facility], [address], [place], [place] [address]    Culture  Setup Time   Final  GRAM NEGATIVE RODS IN BOTH AEROBIC AND ANAEROBIC BOTTLES CRITICAL VALUE NOTED.  VALUE IS CONSISTENT WITH PREVIOUSLY REPORTED AND CALLED VALUE. Performed at [facility], [address] [place], [place] [address]    Culture (A)  Final    ESCHERICHIA COLI SUSCEPTIBILITIES PERFORMED ON PREVIOUS CULTURE WITHIN THE LAST 5 DAYS. Performed at [facility], [place] [address], [place], [place] [address]    Report Status 08/18/2019 FINAL  Final  Blood Culture (routine x 2)     Status: Abnormal   Collection Time: 08/15/19  8:09 PM   Specimen: BLOOD  Result Value Ref Range Status   Specimen Description   Final    BLOOD BLOOD LEFT FOREARM Performed at [facility], [address], [place], [place] [address]    Special Requests   Final    BOTTLES DRAWN AEROBIC AND ANAEROBIC Blood Culture adequate volume Performed at [facility], [address] [place], [place] [address]    Culture  Setup Time   Final    GRAM NEGATIVE RODS IN BOTH AEROBIC AND ANAEROBIC BOTTLES CRITICAL RESULT CALLED TO, READ BACK BY AND VERIFIED WITH: [place] AT [id number] 08/16/2019 SDR Performed at [facility], [place] [address] [place], [place] [address]    Culture ESCHERICHIA COLI (A)  Final   Report Status 08/18/2019 FINAL  Final   Organism ID, Bacteria ESCHERICHIA COLI  Final      Susceptibility   Escherichia coli - MIC*    AMPICILLIN >=32 RESISTANT Resistant     CEFAZOLIN <=4 SENSITIVE Sensitive     CEFEPIME <=1 SENSITIVE Sensitive     CEFTAZIDIME <=1 SENSITIVE Sensitive     CEFTRIAXONE <=1 SENSITIVE Sensitive     CIPROFLOXACIN <=0.25 SENSITIVE Sensitive     GENTAMICIN <=1 SENSITIVE Sensitive     IMIPENEM <=0.25 SENSITIVE Sensitive     TRIMETH/SULFA >=320 RESISTANT Resistant     AMPICILLIN/SULBACTAM 16  INTERMEDIATE Intermediate     PIP/TAZO <=4 SENSITIVE Sensitive     Extended ESBL NEGATIVE Sensitive     * ESCHERICHIA COLI  Blood Culture ID Panel (Reflexed)     Status: Abnormal   Collection Time: 08/15/19  8:09 PM  Result Value Ref Range Status   Enterococcus species NOT DETECTED NOT DETECTED Final   Listeria monocytogenes NOT DETECTED NOT DETECTED Final   Staphylococcus species NOT DETECTED NOT DETECTED Final   Staphylococcus aureus (BCID) NOT DETECTED NOT DETECTED Final   Streptococcus species NOT DETECTED NOT DETECTED Final   Streptococcus agalactiae NOT DETECTED NOT DETECTED Final   Streptococcus pneumoniae NOT DETECTED NOT DETECTED Final   Streptococcus pyogenes NOT DETECTED NOT DETECTED Final   Acinetobacter baumannii NOT DETECTED NOT DETECTED Final   Enterobacteriaceae species DETECTED (A) NOT DETECTED Final    Comment: Enterobacteriaceae represent a large family of gram-negative bacteria, not a single organism. CRITICAL RESULT CALLED TO, READ BACK BY AND VERIFIED WITH:  SCOTT HALL AT [id number] 08/16/2019 SDR    Enterobacter cloacae complex NOT DETECTED NOT DETECTED Final   Escherichia coli DETECTED (A) NOT DETECTED Final    Comment: CRITICAL RESULT CALLED TO, READ BACK BY AND VERIFIED WITH: SCOTT HALL AT [id number] 08/16/2019 SDR    Klebsiella oxytoca NOT DETECTED NOT DETECTED Final   Klebsiella pneumoniae NOT DETECTED NOT DETECTED Final   Proteus species NOT DETECTED NOT DETECTED Final   Serratia marcescens NOT DETECTED NOT DETECTED Final   Carbapenem resistance NOT DETECTED NOT DETECTED Final   Haemophilus influenzae NOT DETECTED  NOT DETECTED Final   Neisseria meningitidis NOT DETECTED NOT DETECTED Final   Pseudomonas aeruginosa NOT DETECTED NOT DETECTED Final   Candida albicans NOT DETECTED NOT DETECTED Final   Candida glabrata NOT DETECTED NOT DETECTED Final   Candida krusei NOT DETECTED NOT DETECTED Final   Candida parapsilosis NOT DETECTED NOT DETECTED Final   Candida  tropicalis NOT DETECTED NOT DETECTED Final    Comment: Performed at [facility], [address], [place], [place] [address]  Urine culture     Status: Abnormal   Collection Time: 08/15/19  8:11 PM   Specimen: In/Out Cath Urine  Result Value Ref Range Status   Specimen Description   Final    IN/OUT CATH URINE Performed at [facility], [address] [place], [place] [address]    Special Requests   Final    NONE Performed at [facility], [place],  [address]    Culture 70,000 COLONIES/mL ESCHERICHIA COLI (A)  Final   Report Status 08/18/2019 FINAL  Final   Organism ID, Bacteria ESCHERICHIA COLI (A)  Final      Susceptibility   Escherichia coli - MIC*    AMPICILLIN >=32 RESISTANT Resistant     CEFAZOLIN <=4 SENSITIVE Sensitive     CEFTRIAXONE <=1 SENSITIVE Sensitive     CIPROFLOXACIN <=0.25 SENSITIVE Sensitive     GENTAMICIN <=1 SENSITIVE Sensitive     IMIPENEM <=0.25 SENSITIVE Sensitive     NITROFURANTOIN <=16 SENSITIVE Sensitive     TRIMETH/SULFA >=320 RESISTANT Resistant     AMPICILLIN/SULBACTAM 16 INTERMEDIATE Intermediate     PIP/TAZO <=4 SENSITIVE Sensitive     Extended ESBL NEGATIVE Sensitive     * 70,000 COLONIES/mL ESCHERICHIA COLI  SARS CORONAVIRUS 2 (TAT 6-24 HRS) Nasopharyngeal Nasopharyngeal Swab     Status: None   Collection Time: 08/15/19  8:47 PM   Specimen: Nasopharyngeal Swab  Result Value Ref Range Status   SARS Coronavirus 2 NEGATIVE NEGATIVE Final    Comment: (NOTE) SARS-CoV-2 target nucleic acids are NOT DETECTED. The SARS-CoV-2 RNA is generally detectable in upper and lower respiratory specimens during the acute phase of infection. Negative results do not preclude SARS-CoV-2 infection, do not rule out co-infections with other pathogens, and should not be used as the sole basis for treatment or other patient management decisions. Negative results must be combined with clinical observations, patient  history, and epidemiological information. The expected result is Negative. Fact Sheet for Patients: SugarRoll.be Fact Sheet for Healthcare Providers: https://www.woods-mathews.com/ This test is not yet approved or cleared by the [place] FDA and  has been authorized for detection and/or diagnosis of SARS-CoV-2 by FDA under an Emergency Use Authorization (EUA). This EUA will remain  in effect (meaning this test can be used) for the duration of the COVID-19 declaration under Section 56 4(b)(1) of the Act, 21 U.S.C. section 360bbb-3(b)(1), unless the authorization is terminated or revoked sooner. Performed at [facility], [place] [address], [place],  [address]   SARS Coronavirus 2 [facility] order, Performed in [facility] hospital lab) Nasopharyngeal Nasopharyngeal Swab     Status: None   Collection Time: 08/16/19  3:30 AM   Specimen: Nasopharyngeal Swab  Result Value Ref Range Status   SARS Coronavirus 2 NEGATIVE NEGATIVE Final    Comment: (NOTE) If result is NEGATIVE SARS-CoV-2 target nucleic acids are NOT DETECTED. The SARS-CoV-2 RNA is generally detectable in upper and lower  respiratory specimens during the acute phase of infection.  The lowest  concentration of SARS-CoV-2 viral copies this assay can detect is 250  copies / mL. A negative result does not preclude SARS-CoV-2 infection  and should not be used as the sole basis for treatment or other  patient management decisions.  A negative result may occur with  improper specimen collection / handling, submission of specimen other  than nasopharyngeal swab, presence of viral mutation(s) within the  areas targeted by this assay, and inadequate number of viral copies  (<250 copies / mL). A negative result must be combined with clinical  observations, patient history, and epidemiological information. If result is POSITIVE SARS-CoV-2 target nucleic acids are DETECTED. The  SARS-CoV-2 RNA is generally detectable in upper and lower  respiratory specimens dur ing the acute phase of infection.  Positive  results are indicative of active infection with SARS-CoV-2.  Clinical  correlation with patient history and other diagnostic information is  necessary to determine patient infection status.  Positive results do  not rule out bacterial infection or co-infection with other viruses. If result is PRESUMPTIVE POSTIVE SARS-CoV-2 nucleic acids MAY BE PRESENT.   A presumptive positive result was obtained on the submitted specimen  and confirmed on repeat testing.  While 2019 novel coronavirus  (SARS-CoV-2) nucleic acids may be present in the submitted sample  additional confirmatory testing may be necessary for epidemiological  and / or clinical management purposes  to differentiate between  SARS-CoV-2 and other Sarbecovirus currently known to infect humans.  If clinically indicated additional testing with an alternate test  methodology [phone]) is advised. The SARS-CoV-2 RNA is generally  detectable in upper and lower respiratory sp ecimens during the acute  phase of infection. The expected result is Negative. Fact Sheet for Patients:  StrictlyIdeas.no Fact Sheet for Healthcare Providers: BankingDealers.co.za This test is not yet approved or cleared by the [place] FDA and has been authorized for detection and/or diagnosis of SARS-CoV-2 by FDA under an Emergency Use Authorization (EUA).  This EUA will remain in effect (meaning this test can be used) for the duration of the COVID-19 declaration under Section 564(b)(1) of the Act, 21 U.S.C. section 360bbb-3(b)(1), unless the authorization is terminated or revoked sooner. Performed at [facility], [address] [place], [place] [address]   MRSA PCR Screening     Status: None   Collection Time: 08/16/19  3:30 AM   Specimen: Nasopharyngeal  Result  Value Ref Range Status   MRSA by PCR NEGATIVE NEGATIVE Final    Comment:        The GeneXpert MRSA Assay (FDA approved for NASAL specimens only), is one component of a comprehensive MRSA colonization surveillance program. It is not intended to diagnose MRSA infection nor to guide or monitor treatment for MRSA infections. Performed at [facility], [address] [place], [place] [address]     RADIOLOGY:  No results found.    Management plans discussed with the patient, family and they are in agreement.  CODE STATUS:     Code Status Orders  (From admission, onward)         Start     Ordered   08/16/19 0102  Full code  Continuous     08/16/19 0102          TOTAL TIME TAKING CARE OF THIS PATIENT: 40 minutes.    Henreitta Leber M.D on 08/18/2019 at 11:17 AM  Between 7am to 6pm - Pager - [phone]  After 6pm go to [email or web address] - password  [email or web address]  [facility]  Office  [phone]  CC: Primary care physician; Christen Bame, DO

## 2019-08-18 NOTE — Plan of Care (Signed)

## 2019-10-30 ENCOUNTER — Ambulatory Visit
Admission: EM | Admit: 2019-10-30 | Discharge: 2019-10-30 | Disposition: A | Discharge status: Home / Self Care | Payer: Medicare HMO | Attending: Emergency Medicine | Admitting: Emergency Medicine

## 2019-10-30 ENCOUNTER — Encounter: Payer: Self-pay | Admitting: Emergency Medicine

## 2019-10-30 ENCOUNTER — Other Ambulatory Visit: Payer: Self-pay

## 2019-10-30 ENCOUNTER — Ambulatory Visit (INDEPENDENT_AMBULATORY_CARE_PROVIDER_SITE_OTHER): Payer: Medicare HMO

## 2019-10-30 DIAGNOSIS — R0602 Shortness of breath: Secondary | ICD-10-CM | POA: Diagnosis not present

## 2019-10-30 DIAGNOSIS — N390 Urinary tract infection, site not specified: Secondary | ICD-10-CM

## 2019-10-30 DIAGNOSIS — R05 Cough: Secondary | ICD-10-CM

## 2019-10-30 DIAGNOSIS — Z20828 Contact with and (suspected) exposure to other viral communicable diseases: Secondary | ICD-10-CM

## 2019-10-30 DIAGNOSIS — Z7189 Other specified counseling: Secondary | ICD-10-CM

## 2019-10-30 DIAGNOSIS — J441 Chronic obstructive pulmonary disease with (acute) exacerbation: Secondary | ICD-10-CM | POA: Diagnosis not present

## 2019-10-30 DIAGNOSIS — Z20822 Contact with and (suspected) exposure to covid-19: Secondary | ICD-10-CM

## 2019-10-30 LAB — URINALYSIS, COMPLETE (UACMP) WITH MICROSCOPIC
Bilirubin Urine: NEGATIVE
Glucose, UA: NEGATIVE mg/dL
Hgb urine dipstick: NEGATIVE
Ketones, ur: NEGATIVE mg/dL
Nitrite: NEGATIVE
Protein, ur: NEGATIVE mg/dL
Specific Gravity, Urine: 1.015 (ref 1.005–1.030)
pH: 5.5 (ref 5.0–8.0)

## 2019-10-30 MED ORDER — PREDNISONE 10 MG (21) PO TBPK: ORAL_TABLET | Freq: Every day | ORAL | 0 refills | Status: DC

## 2019-10-30 MED ORDER — AMOXICILLIN-POT CLAVULANATE 875-125 MG PO TABS: 1.0000 | ORAL_TABLET | Freq: Two times a day (BID) | ORAL | 0 refills | Status: AC

## 2019-10-30 NOTE — ED Triage Notes (Signed)
Patient in today c/o severe fatigue, cough, sob off & on x 3 weeks. Patient had a video visit with her PCP yesterday and is being treated for allergies. Patient is here today for a evaluation and a covid test.

## 2019-10-30 NOTE — Discharge Instructions (Signed)
It was very nice seeing you today in clinic. Thank you for entrusting me with your care.   Rest and drink lots of fluids. Please utilize the medications that we discussed. Your prescriptions has been called in to your pharmacy. May use Tylenol and/or Ibuprofen as needed for pain/fever.   You were tested for SARS-CoV-2 (novel coronavirus) today. Testing is performed by an outside lab ([facility]) and has variable turn around times ranging between 2-5 days. Current recommendations from the the [facility] and [facility] DHHS require that you remain at home until negative test results are have been received. In the event that your test results are positive, you will be contacted with further directives. These measures are being implemented out of an abundance of caution to prevent transmission and spread during the current SARS-CoV-2 pandemic.  Make arrangements to follow up with your regular doctor in 1 week for re-evaluation if not improving. If your symptoms/condition worsens, please seek follow up care either here or in the ER. Please remember, our [place] providers are "right here with you" when you need [place].   Again, it was my pleasure to take care of you today. Thank you for choosing our clinic. I hope that you start to feel better quickly.   Honor Loh, MSN, APRN, FNP-C, CEN Advanced Practice Provider [facility]

## 2019-10-30 NOTE — ED Triage Notes (Signed)
Patient does have COPD

## 2019-10-31 LAB — NOVEL CORONAVIRUS, NAA (HOSP ORDER, SEND-OUT TO REF LAB; TAT 18-24 HRS): SARS-CoV-2, NAA: NOT DETECTED

## 2019-10-31 NOTE — ED Provider Notes (Signed)
[place], [place]   Name: Sophia Andrews DOB: 06/10/1951 MRN: [id number] CSN: [id number] PCP: Christen Bame, DO  Arrival date and time:  10/30/19 1130  Chief Complaint:  Fatigue, Cough, Shortness of Breath, and Generalized Body Aches   NOTE: Prior to seeing the patient today, I have reviewed the triage nursing documentation and vital signs. Clinical staff has updated patient's PMH/PSHx, current medication list, and drug allergies/intolerances to ensure comprehensive history available to assist in medical decision making.   History:   HPI: Sophia Andrews is a 68 y.o. female who presents today with complaints of not feeling well for the last 3 weeks. Patient complains of diffuse myalgias, fatigue, cough, sore throat, headaches, and SOB. Symptoms reported to have been present to varying degrees for 3 weeks, however patient reports that her breathing as worsened over the last few days. She denies any associated fevers. She denies that she has experienced any nausea, vomiting, diarrhea, or abdominal pain. She is eating and drinking well. Patient denies any perceived alterations to her sense of taste or smell. Patient denies being in close contact with anyone known to be ill. She has never been tested for SARS-CoV-2 (novel coronavirus) per her report. Patient advising that she did a tele-health visit yesterday and was advised to take cetirizine and to come in for SARS-CoV-2 (novel coronavirus) testing and exam to assess for AECOPD.   Past Medical History:  Diagnosis Date   Cancer [facility])    Cardiac defibrillator in place    CHF (congestive heart failure) (HCC)    COPD (chronic obstructive pulmonary disease) (HCC)    Hypertension    OSA (obstructive sleep apnea)    Pacemaker     Past Surgical History:  Procedure Laterality Date   ABDOMINAL HYSTERECTOMY     JOINT REPLACEMENT     TONSILLECTOMY      Family History  Problem Relation Age of Onset   Asthma Mother    Heart  disease Mother    Kidney disease Mother    Pancreatic cancer Father     Social History   Tobacco Use   Smoking status: Former Smoker    Quit date: 10/29/2009    Years since quitting: 10.0   Smokeless tobacco: Never Used  Substance Use Topics   Alcohol use: Not Currently   Drug use: Never    Patient Active Problem List   Diagnosis Date Noted   Severe sepsis ([place]) 08/15/2019   UTI (urinary tract infection) [id number]   Chronic systolic CHF (congestive heart failure) ([place]) 08/15/2019   AKI (acute kidney injury) ([place]) 08/15/2019   COPD (chronic obstructive pulmonary disease) ([place]) 08/15/2019   HTN (hypertension) 08/15/2019   OSA (obstructive sleep apnea) 08/15/2019    Home Medications:    Current Meds  Medication Sig   albuterol (VENTOLIN HFA) 108 (90 Base) MCG/ACT inhaler Inhale 2 puffs into the lungs every 6 (six) hours as needed for shortness of breath or wheezing.   allopurinol (ZYLOPRIM) 300 MG tablet Take 300 mg by mouth daily.    atorvastatin (LIPITOR) 40 MG tablet Take 40 mg by mouth daily.   diclofenac sodium (VOLTAREN) 1 % GEL Apply 2 g topically 4 (four) times daily.    DULoxetine (CYMBALTA) 60 MG capsule Take 120 mg by mouth daily.    furosemide (LASIX) 20 MG tablet Take 20 mg by mouth.    gabapentin (NEURONTIN) 400 MG capsule Take 800 mg by mouth 3 (three) times daily.    HYDROcodone-acetaminophen (NORCO/VICODIN) 5-325 MG  tablet Take 1 tablet by mouth 2 (two) times daily as needed for pain.   magnesium oxide (MAG-OX) 400 MG tablet Take 400 mg by mouth daily.   metoprolol succinate (TOPROL-XL) 50 MG 24 hr tablet Take 50 mg by mouth daily.    Multiple Vitamin (MULTIVITAMIN) LIQD Take 5 mLs by mouth daily.   naphazoline-glycerin (CLEAR EYES) 0.012-0.2 % SOLN 1-2 drops 4 (four) times daily as needed for eye irritation.   sacubitril-valsartan (ENTRESTO) 97-103 MG Take 1 tablet by mouth 2 (two) times daily.   tamoxifen (NOLVADEX) 20 MG tablet  Take 20 mg by mouth daily.   tiotropium (SPIRIVA) 18 MCG inhalation capsule Place 18 mcg into inhaler and inhale daily.    Allergies:   Sulfa antibiotics, Ambien [zolpidem tartrate], Lyrica [pregabalin], and Tetanus toxoids  Review of Systems (ROS): Review of Systems  Constitutional: Positive for fatigue (sleeping all day). Negative for fever.  HENT: Positive for congestion, rhinorrhea, sore throat and voice change (hoarse). Negative for ear pain, postnasal drip, sinus pressure, sinus pain and sneezing.   Eyes: Negative for pain, discharge and redness.  Respiratory: Positive for cough and shortness of breath. Negative for chest tightness.        PMH (+) COPD, CHF, OSAH  Cardiovascular: Negative for chest pain and palpitations.  Gastrointestinal: Negative for abdominal pain, diarrhea, nausea and vomiting.  Musculoskeletal: Positive for myalgias. Negative for arthralgias, back pain and neck pain.  Skin: Negative for color change, pallor and rash.  Neurological: Positive for headaches. Negative for dizziness, syncope and weakness.  Hematological: Negative for adenopathy.     Vital Signs: Today's Vitals   10/30/19 1216 10/30/19 1218 10/30/19 1357  BP: 106/67    Pulse: 94    Resp: 18    Temp: 98 F (36.7 C)    TempSrc: Oral    SpO2: 94%    Weight:  221 lb (100.2 kg)   Height:  5\' 4"  (1.626 m)   PainSc:  0-No pain 0-No pain    Physical Exam: Physical Exam  Constitutional: She is oriented to person, place, and time and well-developed, well-nourished, and in no distress.  Acutely ill appearing; fatigued/listless.  HENT:  Head: Normocephalic and atraumatic.  Right Ear: Tympanic membrane normal.  Left Ear: Tympanic membrane normal.  Nose: Mucosal edema and rhinorrhea present. No sinus tenderness.  Mouth/Throat: Uvula is midline and mucous membranes are normal. Posterior oropharyngeal erythema present. No oropharyngeal exudate or posterior oropharyngeal edema.  Eyes: Pupils are  equal, round, and reactive to light. Conjunctivae and EOM are normal.  Neck: Normal range of motion. Neck supple.  Cardiovascular: Normal rate, regular rhythm, normal heart sounds and intact distal pulses. Exam reveals no gallop and no friction rub.  No murmur heard. Pulmonary/Chest: Effort normal. No accessory muscle usage. No respiratory distress. She has wheezes (scattered expiratory). She has rhonchi (upper airways; clears mostly without cough).  Abdominal: Soft. Normal appearance and bowel sounds are normal. She exhibits no distension. There is no abdominal tenderness.  Musculoskeletal: Normal range of motion.  Neurological: She is alert and oriented to person, place, and time. Gait normal.  Skin: Skin is warm and dry. No rash noted.  Psychiatric: Mood, memory, affect and judgment normal.  Nursing note and vitals reviewed.   Urgent Care Treatments / Results:   LABS: PLEASE NOTE: all labs that were ordered this encounter are listed, however only abnormal results are displayed. Labs Reviewed  URINALYSIS, COMPLETE (UACMP) WITH MICROSCOPIC - Abnormal; Notable for the following components:  Result Value   Leukocytes,Ua TRACE (*)    Bacteria, UA MANY (*)    All other components within normal limits  NOVEL CORONAVIRUS, NAA (HOSP ORDER, SEND-OUT TO REF LAB; TAT 18-24 HRS)  URINE CULTURE    EKG: -None  RADIOLOGY: Dg Chest 2 View  Result Date: 10/30/2019 CLINICAL DATA:  Cough and SOB x 3 weeks, no fever, covid pending, COPD EXAM: CHEST - 2 VIEW COMPARISON:  08/16/2019 FINDINGS: The heart size and mediastinal contours are within normal limits. Single lead cardiac pacemaker. Both lungs are clear. The visualized skeletal structures are unremarkable. IMPRESSION: No active cardiopulmonary disease. Electronically Signed   By: Kathreen Devoid   On: 10/30/2019 13:28    PROCEDURES: Procedures  MEDICATIONS RECEIVED THIS VISIT: Medications - No data to display  PERTINENT CLINICAL COURSE  NOTES/UPDATES:   Initial Impression / Assessment and Plan / Urgent Care Course:  Pertinent labs & imaging results that were available during my care of the patient were personally reviewed by me and considered in my medical decision making (see lab/imaging section of note for values and interpretations).  LEIGH BURN is a 68 y.o. female who presents to [facility] today with complaints of Fatigue, Cough, Shortness of Breath, and Generalized Body Aches  Patient is acutely ill appearing (non-toxic) in clinic today. She does not appear to be in any acute distress. Presenting symptoms (see HPI) and exam as documented above. She presents with symptoms associated with SARS-CoV-2 (novel coronavirus). Discussed typical symptom constellation. Reviewed potential for infection and need for testing. Patient amenable to being tested. SARS-CoV-2 swab collected by certified clinical staff. Discussed variable turn around times associated with testing, as swabs are being processed at [facility], and have been taking between 2-5 days to come back. She was advised to self quarantine, per [facility] DHHS guidelines, until negative results received.   Patient became increasingly dyspneic with exertion in clinic today. SPO2 92-94% on room air; reports a baseline of 95%. Patient with cough and audible wheezing. Radiographs of the chest revealed no acute cardiopulmonary process; no evidence of peribronchial thickening, areas of consolidation, or focal infiltrates. Until ruled out with confirmatory lab testing, SARS-CoV-2 remains part of the differential. Her testing is pending at this time. Given the chronicity of her symptoms, current exam, and underlying co-morbidities, will proceed with treatment for AECOPD using a 7 day course of amoxicillin-clavulanate. Additionally, patient is wheezing despite the use of her prescribed inhalers. Will send in prescription of systemic steroid taper. Discussed supportive care measures at home  during acute phase of illness. Patient to rest as much as possible. She was encouraged to ensure adequate hydration (water and ORS) to prevent dehydration and electrolyte derangements. Patient may use APAP and/or IBU on an as needed basis for pain/fever.   Patient requesting for UA to be checked today while here citing recurrent urinary tract infections. UA (+) for trace LE and many bacteria. Will send for culture and sensitivity. ABX prescribed for respiratory complaint will hopefully cover any pathogens in the urine as well. She was advised that if culture demonstrates significant growth and resistance to the prescribed antibiotic, she will be contacted and advised of the need to change the antibiotic being used to treat her infection. Patient encouraged to increase her fluid intake as much as possible. Discussed that water is always best to flush the urinary tract.   Discussed follow up with primary care physician in 1 week for re-evaluation. I have reviewed the follow up and strict  return precautions for any new or worsening symptoms. Patient is aware of symptoms that would be deemed urgent/emergent, and would thus require further evaluation either here or in the emergency department. At the time of discharge, she verbalized understanding and consent with the discharge plan as it was reviewed with her. All questions were fielded by provider and/or clinic staff prior to patient discharge.    Final Clinical Impressions / Urgent Care Diagnoses:   Final diagnoses:  COPD exacerbation ([place])  Urinary tract infection without hematuria, site unspecified  Encounter for laboratory testing for COVID-19 virus  Advice given about COVID-19 virus infection    New Prescriptions:  [place] Controlled Substance Registry consulted? Not Applicable  Meds ordered this encounter  Medications   amoxicillin-clavulanate (AUGMENTIN) 875-125 MG tablet    Sig: Take 1 tablet by mouth 2 (two) times daily for 7 days.     Dispense:  14 tablet    Refill:  0   predniSONE (STERAPRED UNI-PAK 21 TAB) 10 MG (21) TBPK tablet    Sig: Take by mouth daily. 60 mg x 1 day, 50 mg x 1 day, 40 mg x 1 day, 30 mg x 1 day, 20 mg x 1 day, 10 mg x 1 day    Dispense:  21 tablet    Refill:  0    Recommended Follow up Care:  Patient encouraged to follow up with the following provider within the specified time frame, or sooner as dictated by the severity of her symptoms. As always, she was instructed that for any urgent/emergent care needs, she should seek care either here or in the emergency department for more immediate evaluation.  Follow-up Information    Carrie Mew Homayoun, DO In 1 week.   Specialty: Family Medicine Why: General reassessment of symptoms if not improving Contact information: [address] [phone] [facility] [id number] [phone]         NOTE: This note was prepared using Dragon dictation software along with smaller phrase technology. Despite my best ability to proofread, there is the potential that transcriptional errors may still occur from this process, and are completely unintentional.    Karen Kitchens, NP 10/31/19 [phone]

## 2019-11-02 LAB — URINE CULTURE: Culture: >=100000 — AB

## 2019-11-05 ENCOUNTER — Telehealth (HOSPITAL_COMMUNITY): Payer: Self-pay | Admitting: Emergency Medicine

## 2019-11-05 NOTE — Telephone Encounter (Signed)
Contacted patient about her results, states she never has symptoms with her urine cultures. Pt advised to follow up with her PCP for recheck. Verbalized understanding, all questions answered.

## 2019-11-26 ENCOUNTER — Other Ambulatory Visit: Payer: Self-pay

## 2019-11-26 ENCOUNTER — Ambulatory Visit
Admission: EM | Admit: 2019-11-26 | Discharge: 2019-11-26 | Disposition: A | Discharge status: Home / Self Care | Payer: Medicare HMO | Attending: Internal Medicine | Admitting: Internal Medicine

## 2019-11-26 DIAGNOSIS — Z87891 Personal history of nicotine dependence: Secondary | ICD-10-CM | POA: Diagnosis not present

## 2019-11-26 DIAGNOSIS — I110 Hypertensive heart disease with heart failure: Secondary | ICD-10-CM | POA: Diagnosis not present

## 2019-11-26 DIAGNOSIS — Z950 Presence of cardiac pacemaker: Secondary | ICD-10-CM | POA: Diagnosis not present

## 2019-11-26 DIAGNOSIS — I5022 Chronic systolic (congestive) heart failure: Secondary | ICD-10-CM | POA: Insufficient documentation

## 2019-11-26 DIAGNOSIS — R0602 Shortness of breath: Secondary | ICD-10-CM | POA: Diagnosis not present

## 2019-11-26 DIAGNOSIS — Z20828 Contact with and (suspected) exposure to other viral communicable diseases: Secondary | ICD-10-CM | POA: Insufficient documentation

## 2019-11-26 DIAGNOSIS — Z79899 Other long term (current) drug therapy: Secondary | ICD-10-CM | POA: Diagnosis not present

## 2019-11-26 DIAGNOSIS — J441 Chronic obstructive pulmonary disease with (acute) exacerbation: Secondary | ICD-10-CM | POA: Insufficient documentation

## 2019-11-26 DIAGNOSIS — N179 Acute kidney failure, unspecified: Secondary | ICD-10-CM | POA: Insufficient documentation

## 2019-11-26 DIAGNOSIS — G4733 Obstructive sleep apnea (adult) (pediatric): Secondary | ICD-10-CM | POA: Diagnosis not present

## 2019-11-26 LAB — CBC WITH DIFFERENTIAL/PLATELET
Abs Immature Granulocytes: 0.11 10*3/uL — ABNORMAL HIGH (ref 0.00–0.07)
Basophils Absolute: 0.1 10*3/uL (ref 0.0–0.1)
Basophils Relative: 0 %
Eosinophils Absolute: 0.4 10*3/uL (ref 0.0–0.5)
Eosinophils Relative: 3 %
HCT: 40.1 % (ref 36.0–46.0)
Hemoglobin: 12.8 g/dL (ref 12.0–15.0)
Immature Granulocytes: 1 %
Lymphocytes Relative: 26 %
Lymphs Abs: 3.1 10*3/uL (ref 0.7–4.0)
MCH: 33.1 pg (ref 26.0–34.0)
MCHC: 31.9 g/dL (ref 30.0–36.0)
MCV: 103.6 fL — ABNORMAL HIGH (ref 80.0–100.0)
Monocytes Absolute: 1 10*3/uL (ref 0.1–1.0)
Monocytes Relative: 9 %
Neutro Abs: 7 10*3/uL (ref 1.7–7.7)
Neutrophils Relative %: 61 %
Platelets: 227 10*3/uL (ref 150–400)
RBC: 3.87 MIL/uL (ref 3.87–5.11)
RDW: 14.6 % (ref 11.5–15.5)
WBC: 11.7 10*3/uL — ABNORMAL HIGH (ref 4.0–10.5)
nRBC: 0.2 % (ref 0.0–0.2)

## 2019-11-26 MED ORDER — DOXYCYCLINE HYCLATE 100 MG PO CAPS: 100.0000 mg | ORAL_CAPSULE | Freq: Two times a day (BID) | ORAL | 0 refills | Status: AC

## 2019-11-26 MED ORDER — PREDNISONE 10 MG (21) PO TBPK: ORAL_TABLET | Freq: Every day | ORAL | 0 refills | Status: DC

## 2019-11-26 MED ORDER — ALBUTEROL SULFATE HFA 108 (90 BASE) MCG/ACT IN AERS: 2.0000 | INHALATION_SPRAY | Freq: Four times a day (QID) | RESPIRATORY_TRACT | 0 refills | Status: AC | PRN

## 2019-11-26 NOTE — ED Provider Notes (Signed)
[facility]    CSN: [id number] Arrival date & time: 11/26/19  1255      History   Chief Complaint Chief Complaint  Patient presents with  . Shortness of Breath    HPI Sophia Andrews is a 68 y.o. female with a history of COPD, CHF-chronic comes to urgent care with complaints of worsening shortness of breath, increasing fatigue over the past month or so.  Patient says that the symptoms have been intermittent.  She tried to make an appointment to see her primary care physician but was told that she needed Covid testing.  On further questioning patient admits to having wheezing.  Denied any sputum production or chest pain.  No fever or chills.  She denies any orthopnea, paroxysmal nocturnal dyspnea or lower leg swelling.  Current medications for COPD has not helped her symptoms.  Patient denies any loss of taste or smell.  No sick contacts or exposures.Marland Kitchen   HPI  Past Medical History:  Diagnosis Date  . Cancer ([place])   . Cardiac defibrillator in place   . CHF (congestive heart failure) ([place])   . COPD (chronic obstructive pulmonary disease) ([place])   . Hypertension   . OSA (obstructive sleep apnea)   . Pacemaker     Patient Active Problem List   Diagnosis Date Noted  . Severe sepsis ([place]) 08/15/2019  . UTI (urinary tract infection) 08/15/2019  . Chronic systolic CHF (congestive heart failure) ([place]) 08/15/2019  . AKI (acute kidney injury) ([place]) 08/15/2019  . COPD (chronic obstructive pulmonary disease) ([place]) 08/15/2019  . HTN (hypertension) 08/15/2019  . OSA (obstructive sleep apnea) 08/15/2019    Past Surgical History:  Procedure Laterality Date  . ABDOMINAL HYSTERECTOMY    . JOINT REPLACEMENT    . TONSILLECTOMY      OB History   No obstetric history on file.      Home Medications    Prior to Admission medications   Medication Sig Start Date End Date Taking? Authorizing Provider  allopurinol (ZYLOPRIM) 300 MG tablet Take 300 mg by mouth daily.    Yes  [provider]  atorvastatin (LIPITOR) 40 MG tablet Take 40 mg by mouth daily.   Yes [provider]  diclofenac sodium (VOLTAREN) 1 % GEL Apply 2 g topically 4 (four) times daily.    Yes [provider]  DULoxetine (CYMBALTA) 60 MG capsule Take 120 mg by mouth daily.    Yes [provider]  furosemide (LASIX) 20 MG tablet Take 20 mg by mouth.    Yes [provider]  gabapentin (NEURONTIN) 400 MG capsule Take 800 mg by mouth 3 (three) times daily.    Yes [provider]  HYDROcodone-acetaminophen (NORCO/VICODIN) 5-325 MG tablet Take 1 tablet by mouth 2 (two) times daily as needed for pain. 06/22/19  Yes [provider]  magnesium oxide (MAG-OX) 400 MG tablet Take 400 mg by mouth daily.   Yes [provider]  metoprolol succinate (TOPROL-XL) 50 MG 24 hr tablet Take 50 mg by mouth daily.    Yes [provider]  Multiple Vitamin (MULTIVITAMIN) LIQD Take 5 mLs by mouth daily.   Yes [provider]  naphazoline-glycerin (CLEAR EYES) 0.012-0.2 % SOLN 1-2 drops 4 (four) times daily as needed for eye irritation.   Yes [provider]  sacubitril-valsartan (ENTRESTO) 97-103 MG Take 1 tablet by mouth 2 (two) times daily. 12/11/18  Yes [provider]  spironolactone (ALDACTONE) 25 MG tablet Take 25 mg by  mouth daily.   Yes [provider]  tamoxifen (NOLVADEX) 20 MG tablet Take 20 mg by mouth daily.   Yes [provider]  tiotropium (SPIRIVA) 18 MCG inhalation capsule Place 18 mcg into inhaler and inhale daily.   Yes [provider]  albuterol (VENTOLIN HFA) 108 (90 Base) MCG/ACT inhaler Inhale 2 puffs into the lungs every 6 (six) hours as needed for shortness of breath or wheezing. 11/26/19   Darrious Youman, Myrene Galas, MD  doxycycline (VIBRAMYCIN) 100 MG capsule Take 1 capsule (100 mg total) by mouth 2 (two) times daily for 7 days. 11/26/19 12/03/19  Chase Picket, MD  predniSONE  (STERAPRED UNI-PAK 21 TAB) 10 MG (21) TBPK tablet Take by mouth daily. 60 mg x 1 day, 50 mg x 1 day, 40 mg x 1 day, 30 mg x 1 day, 20 mg x 1 day, 10 mg x 1 day 11/26/19   Chase Picket, MD    Family History Family History  Problem Relation Age of Onset  . Asthma Mother   . Heart disease Mother   . Kidney disease Mother   . Pancreatic cancer Father     Social History Social History   Tobacco Use  . Smoking status: Former Smoker    Quit date: 10/29/2009    Years since quitting: 10.0  . Smokeless tobacco: Never Used  Substance Use Topics  . Alcohol use: Not Currently  . Drug use: Never     Allergies   Sulfa antibiotics, Ambien [zolpidem tartrate], Lyrica [pregabalin], and Tetanus toxoids   Review of Systems Review of Systems  Constitutional: Positive for activity change and fatigue. Negative for chills and fever.  HENT: Negative.   Respiratory: Positive for cough, shortness of breath and wheezing. Negative for chest tightness.   Cardiovascular: Negative for chest pain and palpitations.  Gastrointestinal: Negative for nausea and vomiting.  Genitourinary: Negative.   Skin: Negative for rash and wound.  Neurological: Negative for dizziness, light-headedness and headaches.  Psychiatric/Behavioral: Negative for confusion and decreased concentration.     Physical Exam Triage Vital Signs ED Triage Vitals  Enc Vitals Group     BP 11/26/19 1320 (!) 109/58     Pulse Rate 11/26/19 1320 94     Resp 11/26/19 1320 16     Temp 11/26/19 1320 99.1 F (37.3 C)     Temp Source 11/26/19 1320 Oral     SpO2 11/26/19 1320 95 %     Weight 11/26/19 1318 226 lb (102.5 kg)     Height 11/26/19 1318 5\' 5"  (1.651 m)     Head Circumference --      Peak Flow --      Pain Score 11/26/19 1317 0     Pain Loc --      Pain Edu? --      Excl. in [place]? --    No data found.  Updated Vital Signs BP (!) 109/58 (BP Location: Left Arm)   Pulse 94   Temp 99.1 F (37.3 C) (Oral)   Resp 16    Ht 5\' 5"  (1.651 m)   Wt 102.5 kg   SpO2 95%   BMI 37.61 kg/m   Visual Acuity Right Eye Distance:   Left Eye Distance:   Bilateral Distance:    Right Eye Near:   Left Eye Near:    Bilateral Near:     Physical Exam Vitals and nursing note reviewed.  Constitutional:      General: She is not in  acute distress.    Appearance: She is ill-appearing.  HENT:     Mouth/Throat:     Mouth: Mucous membranes are moist.     Pharynx: No oropharyngeal exudate.  Neck:     Thyroid: No thyromegaly.  Cardiovascular:     Rate and Rhythm: Normal rate and regular rhythm.  Pulmonary:     Effort: Pulmonary effort is normal. No tachypnea.     Breath sounds: Examination of the right-lower field reveals decreased breath sounds and wheezing. Examination of the left-lower field reveals decreased breath sounds and wheezing. Decreased breath sounds and wheezing present. No rhonchi or rales.  Chest:     Chest wall: No mass or edema. There is no dullness to percussion.  Abdominal:     General: Bowel sounds are normal.     Palpations: Abdomen is soft. There is no mass.     Tenderness: There is no abdominal tenderness.  Musculoskeletal:        General: Normal range of motion.     Cervical back: Normal range of motion.     Right lower leg: No edema.     Left lower leg: No edema.  Lymphadenopathy:     Cervical: No cervical adenopathy.  Skin:    General: Skin is warm.     Capillary Refill: Capillary refill takes less than 2 seconds.     Findings: No erythema or rash.  Neurological:     General: No focal deficit present.     Mental Status: She is alert.      UC Treatments / Results  Labs (all labs ordered are listed, but only abnormal results are displayed) Labs Reviewed  CBC WITH DIFFERENTIAL/PLATELET - Abnormal; Notable for the following components:      Result Value   WBC 11.7 (*)    MCV 103.6 (*)    Abs Immature Granulocytes 0.11 (*)    All other components within normal limits  NOVEL  CORONAVIRUS, NAA (HOSP ORDER, SEND-OUT TO REF LAB; TAT 18-24 HRS)    EKG   Radiology No results found.  Procedures Procedures (including critical care time)  Medications Ordered in UC Medications - No data to display  Initial Impression / Assessment and Plan / UC Course  I have reviewed the triage vital signs and the nursing notes.  Pertinent labs & imaging results that were available during my care of the patient were reviewed by me and considered in my medical decision making (see chart for details).    1.  COPD with acute exacerbation: Refill albuterol Prednisone tapering dose Doxycycline 100 mg twice daily for 7 days No indication for chest x-ray at this time Patient's pulse oximetry is 95% Patient is advised to return to urgent care if her symptoms worsen.  2.  Chronic CHF: No exacerbation from physical exam Continue current diuretic regimen. Patient is advised to weigh herself daily and call her primary care physician if her weight is 3 pounds over her baseline weight. Final Clinical Impressions(s) / UC Diagnoses   Final diagnoses:  COPD exacerbation [facility])   Discharge Instructions   None    ED Prescriptions    Medication Sig Dispense Auth. Provider   predniSONE (STERAPRED UNI-PAK 21 TAB) 10 MG (21) TBPK tablet Take by mouth daily. 60 mg x 1 day, 50 mg x 1 day, 40 mg x 1 day, 30 mg x 1 day, 20 mg x 1 day, 10 mg x 1 day 21 tablet Mera Gunkel, Myrene Galas, MD   albuterol (VENTOLIN HFA)  108 (90 Base) MCG/ACT inhaler Inhale 2 puffs into the lungs every 6 (six) hours as needed for shortness of breath or wheezing. 18 g Kinsler Soeder, Myrene Galas, MD   doxycycline (VIBRAMYCIN) 100 MG capsule Take 1 capsule (100 mg total) by mouth 2 (two) times daily for 7 days. 14 capsule Greta Yung, Myrene Galas, MD     PDMP not reviewed this encounter.   Chase Picket, MD 11/27/19 1145

## 2019-11-26 NOTE — ED Triage Notes (Signed)
Patient states that she has shortness of breath and COPD and states that she is very fatigued. Reports that this started months ago. States that she is due to see her primary doctor on Wednesday but needs a Covid test. Patient states that symptoms are off and on.

## 2019-11-27 LAB — NOVEL CORONAVIRUS, NAA (HOSP ORDER, SEND-OUT TO REF LAB; TAT 18-24 HRS): SARS-CoV-2, NAA: NOT DETECTED

## 2020-03-27 ENCOUNTER — Ambulatory Visit: Payer: Medicare HMO | Attending: Internal Medicine

## 2020-03-27 DIAGNOSIS — Z20822 Contact with and (suspected) exposure to covid-19: Secondary | ICD-10-CM

## 2020-03-28 LAB — SARS-COV-2, NAA 2 DAY TAT

## 2020-03-28 LAB — NOVEL CORONAVIRUS, NAA: SARS-CoV-2, NAA: NOT DETECTED

## 2021-03-08 ENCOUNTER — Emergency Department
Admission: EM | Admit: 2021-03-08 | Discharge: 2021-03-08 | Disposition: A | Discharge status: Home / Self Care | Payer: Medicare HMO | Attending: Emergency Medicine | Admitting: Emergency Medicine

## 2021-03-08 ENCOUNTER — Other Ambulatory Visit: Payer: Self-pay

## 2021-03-08 DIAGNOSIS — Z79899 Other long term (current) drug therapy: Secondary | ICD-10-CM | POA: Diagnosis not present

## 2021-03-08 DIAGNOSIS — Z4502 Encounter for adjustment and management of automatic implantable cardiac defibrillator: Secondary | ICD-10-CM

## 2021-03-08 DIAGNOSIS — Z87891 Personal history of nicotine dependence: Secondary | ICD-10-CM | POA: Diagnosis not present

## 2021-03-08 DIAGNOSIS — I472 Ventricular tachycardia: Secondary | ICD-10-CM | POA: Diagnosis not present

## 2021-03-08 DIAGNOSIS — T82897A Other specified complication of cardiac prosthetic devices, implants and grafts, initial encounter: Secondary | ICD-10-CM | POA: Insufficient documentation

## 2021-03-08 DIAGNOSIS — Z9660 Presence of unspecified orthopedic joint implant: Secondary | ICD-10-CM | POA: Diagnosis not present

## 2021-03-08 DIAGNOSIS — Z950 Presence of cardiac pacemaker: Secondary | ICD-10-CM | POA: Insufficient documentation

## 2021-03-08 DIAGNOSIS — I110 Hypertensive heart disease with heart failure: Secondary | ICD-10-CM | POA: Diagnosis not present

## 2021-03-08 DIAGNOSIS — I5022 Chronic systolic (congestive) heart failure: Secondary | ICD-10-CM | POA: Diagnosis not present

## 2021-03-08 DIAGNOSIS — J449 Chronic obstructive pulmonary disease, unspecified: Secondary | ICD-10-CM | POA: Insufficient documentation

## 2021-03-08 DIAGNOSIS — I4729 Other ventricular tachycardia: Secondary | ICD-10-CM

## 2021-03-08 LAB — CBC WITH DIFFERENTIAL/PLATELET
Abs Immature Granulocytes: 0.04 10*3/uL (ref 0.00–0.07)
Basophils Absolute: 0.1 10*3/uL (ref 0.0–0.1)
Basophils Relative: 1 %
Eosinophils Absolute: 0.1 10*3/uL (ref 0.0–0.5)
Eosinophils Relative: 1 %
HCT: 39.3 % (ref 36.0–46.0)
Hemoglobin: 12.8 g/dL (ref 12.0–15.0)
Immature Granulocytes: 1 %
Lymphocytes Relative: 23 %
Lymphs Abs: 1.7 10*3/uL (ref 0.7–4.0)
MCH: 34.1 pg — ABNORMAL HIGH (ref 26.0–34.0)
MCHC: 32.6 g/dL (ref 30.0–36.0)
MCV: 104.8 fL — ABNORMAL HIGH (ref 80.0–100.0)
Monocytes Absolute: 0.4 10*3/uL (ref 0.1–1.0)
Monocytes Relative: 6 %
Neutro Abs: 5.2 10*3/uL (ref 1.7–7.7)
Neutrophils Relative %: 68 %
Platelets: 230 10*3/uL (ref 150–400)
RBC: 3.75 MIL/uL — ABNORMAL LOW (ref 3.87–5.11)
RDW: 14.1 % (ref 11.5–15.5)
WBC: 7.6 10*3/uL (ref 4.0–10.5)
nRBC: 0.3 % — ABNORMAL HIGH (ref 0.0–0.2)

## 2021-03-08 LAB — COMPREHENSIVE METABOLIC PANEL
ALT: 17 U/L (ref 0–44)
AST: 37 U/L (ref 15–41)
Albumin: 3.3 g/dL — ABNORMAL LOW (ref 3.5–5.0)
Alkaline Phosphatase: 64 U/L (ref 38–126)
Anion gap: 7 (ref 5–15)
BUN: 11 mg/dL (ref 8–23)
CO2: 28 mmol/L (ref 22–32)
Calcium: 7.8 mg/dL — ABNORMAL LOW (ref 8.9–10.3)
Chloride: 105 mmol/L (ref 98–111)
Creatinine, Ser: 0.88 mg/dL (ref 0.44–1.00)
GFR, Estimated: >60 mL/min (ref >60)
Glucose, Bld: 200 mg/dL — ABNORMAL HIGH (ref 70–99)
Potassium: 3.5 mmol/L (ref 3.5–5.1)
Sodium: 140 mmol/L (ref 135–145)
Total Bilirubin: 0.8 mg/dL (ref 0.3–1.2)
Total Protein: 6.2 g/dL — ABNORMAL LOW (ref 6.5–8.1)

## 2021-03-08 LAB — TROPONIN I (HIGH SENSITIVITY)
Troponin I (High Sensitivity): 124 ng/L (ref <18)
Troponin I (High Sensitivity): 269 ng/L (ref <18)

## 2021-03-08 LAB — BRAIN NATRIURETIC PEPTIDE: B Natriuretic Peptide: 779.2 pg/mL — ABNORMAL HIGH (ref 0.0–100.0)

## 2021-03-08 MED ORDER — MAGNESIUM SULFATE IN D5W 1-5 GM/100ML-% IV SOLN
1.0000 g | Freq: Once | INTRAVENOUS | Status: AC
  Administered 2021-03-08: 1 g via INTRAVENOUS
  Filled 2021-03-08 (×2): qty 100

## 2021-03-08 NOTE — ED Provider Notes (Signed)
[facility] Emergency Department Provider Note   ____________________________________________   Event Date/Time   First MD Initiated Contact with Patient 03/08/21 1106     (approximate)  I have reviewed the triage vital signs and the nursing notes.   HISTORY  Chief Complaint Medical Clearance (Shocked by pacemaker; pt has no complaints currently)    HPI Sophia Andrews is a 70 y.o. female with a stated past medical history of breast cancer in remission status post chemo and radiation, CHF with defibrillator in place, COPD, and hypertension who presents via EMS after her defibrillator shocked her twice approximately 1 hour prior to arrival.  Patient did not feel any preceding symptoms and denies any palpitations, chest pain, shortness of breath, or diaphoresis prior to these shocks.  Patient denies any complaints at this time as well.  Patient currently denies any vision changes, tinnitus, difficulty speaking, facial droop, sore throat, chest pain, shortness of breath, abdominal pain, nausea/vomiting/diarrhea, dysuria, or weakness/numbness/paresthesias in any extremity         Past Medical History:  Diagnosis Date  . Cancer ([place])   . Cardiac defibrillator in place   . CHF (congestive heart failure) ([place])   . COPD (chronic obstructive pulmonary disease) ([place])   . Hypertension   . OSA (obstructive sleep apnea)   . Pacemaker     Patient Active Problem List   Diagnosis Date Noted  . Severe sepsis ([place]) 08/15/2019  . UTI (urinary tract infection) 08/15/2019  . Chronic systolic CHF (congestive heart failure) ([place]) 08/15/2019  . AKI (acute kidney injury) ([place]) 08/15/2019  . COPD (chronic obstructive pulmonary disease) ([place]) 08/15/2019  . HTN (hypertension) 08/15/2019  . OSA (obstructive sleep apnea) 08/15/2019    Past Surgical History:  Procedure Laterality Date  . ABDOMINAL HYSTERECTOMY    . JOINT REPLACEMENT    . TONSILLECTOMY      Prior to  Admission medications   Medication Sig Start Date End Date Taking? Authorizing Provider  albuterol (VENTOLIN HFA) 108 (90 Base) MCG/ACT inhaler Inhale 2 puffs into the lungs every 6 (six) hours as needed for shortness of breath or wheezing. 11/26/19   Chase Picket, MD  allopurinol (ZYLOPRIM) 300 MG tablet Take 300 mg by mouth daily.     [provider]  atorvastatin (LIPITOR) 40 MG tablet Take 40 mg by mouth daily.    [provider]  diclofenac sodium (VOLTAREN) 1 % GEL Apply 2 g topically 4 (four) times daily.     [provider]  DULoxetine (CYMBALTA) 60 MG capsule Take 120 mg by mouth daily.     [provider]  furosemide (LASIX) 20 MG tablet Take 20 mg by mouth.     [provider]  gabapentin (NEURONTIN) 400 MG capsule Take 800 mg by mouth 3 (three) times daily.     [provider]  HYDROcodone-acetaminophen (NORCO/VICODIN) 5-325 MG tablet Take 1 tablet by mouth 2 (two) times daily as needed for pain. 06/22/19   [provider]  magnesium oxide (MAG-OX) 400 MG tablet Take 400 mg by mouth daily.    [provider]  metoprolol succinate (TOPROL-XL) 50 MG 24 hr tablet Take 50 mg by mouth daily.     [provider]  Multiple Vitamin (MULTIVITAMIN) LIQD Take 5 mLs by mouth daily.    [provider]  naphazoline-glycerin (CLEAR EYES) 0.012-0.2 % SOLN 1-2 drops 4 (four) times daily as needed for eye irritation.    [provider]  predniSONE (STERAPRED UNI-PAK 21 TAB) 10 MG (21) TBPK tablet Take by mouth daily. 60 mg x 1 day, 50 mg x 1 day, 40 mg x 1 day, 30 mg x 1 day, 20 mg x 1 day, 10 mg x 1 day 11/26/19   Chase Picket, MD  sacubitril-valsartan (ENTRESTO) 97-103 MG Take 1 tablet by mouth 2 (two) times daily. 12/11/18   [provider]  spironolactone (ALDACTONE) 25 MG tablet Take 25 mg by mouth daily.    [provider]  tamoxifen (NOLVADEX) 20 MG tablet Take 20 mg by mouth  daily.    [provider]  tiotropium (SPIRIVA) 18 MCG inhalation capsule Place 18 mcg into inhaler and inhale daily.    [provider]    Allergies Sulfa antibiotics, Ambien [zolpidem tartrate], Lyrica [pregabalin], and Tetanus toxoids  Family History  Problem Relation Age of Onset  . Asthma Mother   . Heart disease Mother   . Kidney disease Mother   . Pancreatic cancer Father     Social History Social History   Tobacco Use  . Smoking status: Former Smoker    Quit date: 10/29/2009    Years since quitting: 11.3  . Smokeless tobacco: Never Used  Vaping Use  . Vaping Use: Never used  Substance Use Topics  . Alcohol use: Not Currently  . Drug use: Never    Review of Systems Constitutional: No fever/chills Eyes: No visual changes. ENT: No sore throat. Cardiovascular: Denies chest pain. Respiratory: Denies shortness of breath. Gastrointestinal: No abdominal pain.  No nausea, no vomiting.  No diarrhea. Genitourinary: Negative for dysuria. Musculoskeletal: Negative for acute arthralgias Skin: Negative for rash. Neurological: Negative for headaches, weakness/numbness/paresthesias in any extremity Psychiatric: Negative for suicidal ideation/homicidal ideation   ____________________________________________   PHYSICAL EXAM:  VITAL SIGNS: ED Triage Vitals  Enc Vitals Group     BP      Pulse      Resp      Temp      Temp src      SpO2      Weight      Height      Head Circumference      Peak Flow      Pain Score      Pain Loc      Pain Edu?      Excl. in [place]?    Constitutional: Alert and oriented. Well appearing and in no acute distress. Eyes: Conjunctivae are normal. PERRL. Head: Atraumatic. Nose: No congestion/rhinnorhea. Mouth/Throat: Mucous membranes are moist. Neck: No stridor Cardiovascular: Grossly normal heart sounds.  Good peripheral circulation. Respiratory: Normal respiratory effort.  No retractions. Gastrointestinal: Soft and  nontender. No distention. Musculoskeletal: No obvious deformities Neurologic:  Normal speech and language. No gross focal neurologic deficits are appreciated. Skin:  Skin is warm and dry. No rash noted. Psychiatric: Mood and affect are normal. Speech and behavior are normal.  ____________________________________________   LABS (all labs ordered are listed, but only abnormal results are displayed)  Labs Reviewed  BRAIN NATRIURETIC PEPTIDE - Abnormal; Notable for the following components:      Result Value   B Natriuretic Peptide 779.2 (*)    All other components within normal limits  CBC WITH DIFFERENTIAL/PLATELET - Abnormal; Notable for the following components:   RBC 3.75 (*)    MCV 104.8 (*)    MCH 34.1 (*)    nRBC 0.3 (*)    All other components within normal limits  COMPREHENSIVE METABOLIC  PANEL - Abnormal; Notable for the following components:   Glucose, Bld 200 (*)    Calcium 7.8 (*)    Total Protein 6.2 (*)    Albumin 3.3 (*)    All other components within normal limits  TROPONIN I (HIGH SENSITIVITY) - Abnormal; Notable for the following components:   Troponin I (High Sensitivity) 124 (*)    All other components within normal limits  TROPONIN I (HIGH SENSITIVITY) - Abnormal; Notable for the following components:   Troponin I (High Sensitivity) 269 (*)    All other components within normal limits   ____________________________________________  EKG  ED ECG REPORT I, Naaman Plummer, the attending physician, personally viewed and interpreted this ECG.  Date: 03/08/2021 EKG Time: 1110 Rate: 106 Rhythm: tachycardic sinus rhythm QRS Axis: normal Intervals: normal ST/T Wave abnormalities: normal Narrative Interpretation: no evidence of acute ischemia  PROCEDURES  Procedure(s) performed (including Critical Care):  .1-3 Lead EKG Interpretation Performed by: Naaman Plummer, MD Authorized by: Naaman Plummer, MD     Interpretation: normal     ECG rate:  92    ECG rate assessment: normal     Rhythm: sinus rhythm     Ectopy: none     Conduction: normal       ____________________________________________   INITIAL IMPRESSION / ASSESSMENT AND PLAN / ED COURSE  As part of my medical decision making, I reviewed the following data within the [facility] notes reviewed and incorporated, Labs reviewed, EKG interpreted, Old chart reviewed, and Notes from prior ED visits reviewed and incorporated        Patient is a 70 year old female who presents after her defibrillator discharge just prior to arrival.  Patient states that she got 2 shocks.  After interrogating her pacemaker, there was a prolonged run of ventricular tachycardia that took 241 J bursts to correct.  Patient has not had any further defibrillations since coming to the emergency department.  Patient's laboratory evaluation shows no evidence of red flags.  Patient states that she has close follow-up with her cardiologist and plans to follow-up in the next 1-3 days.  The patient has been reexamined and is ready to be discharged.  All diagnostic results have been reviewed and discussed with the patient/family.  Care plan has been outlined and the patient/family understands all current diagnoses, results, and treatment plans.  There are no new complaints, changes, or physical findings at this time.  All questions have been addressed and answered.  Patient was instructed to, and agrees to follow-up with their primary care physician as well as return to the emergency department if any new or worsening symptoms develop.      ____________________________________________   FINAL CLINICAL IMPRESSION(S) / ED DIAGNOSES  Final diagnoses:  Defibrillator discharge  Ventricular tachycardia, non-sustained [facility])     ED Discharge Orders    None       Note:  This document was prepared using Dragon voice recognition software and may include unintentional dictation errors.    Naaman Plummer, MD 03/09/21 [phone]

## 2021-03-08 NOTE — ED Triage Notes (Signed)
Pt brought in via EMS after being shocked by her pacemaker twice this morning.  Pt denies CP.  C/O some SOB, but reports she is always short of breath because she has COPD

## 2021-03-27 ENCOUNTER — Emergency Department: Payer: Medicare HMO

## 2021-03-27 ENCOUNTER — Other Ambulatory Visit (HOSPITAL_COMMUNITY): Payer: Self-pay

## 2021-03-27 ENCOUNTER — Inpatient Hospital Stay
Admission: EM | Admit: 2021-03-27 | Discharge: 2021-03-29 | DRG: 309 | Disposition: A | Discharge status: Home / Self Care | Payer: Medicare HMO | Attending: Internal Medicine | Admitting: Internal Medicine

## 2021-03-27 ENCOUNTER — Encounter: Payer: Self-pay | Admitting: Internal Medicine

## 2021-03-27 DIAGNOSIS — I5042 Chronic combined systolic (congestive) and diastolic (congestive) heart failure: Secondary | ICD-10-CM | POA: Diagnosis present

## 2021-03-27 DIAGNOSIS — Z79899 Other long term (current) drug therapy: Secondary | ICD-10-CM

## 2021-03-27 DIAGNOSIS — Z4502 Encounter for adjustment and management of automatic implantable cardiac defibrillator: Secondary | ICD-10-CM | POA: Diagnosis not present

## 2021-03-27 DIAGNOSIS — I10 Essential (primary) hypertension: Secondary | ICD-10-CM | POA: Diagnosis not present

## 2021-03-27 DIAGNOSIS — Z6841 Body Mass Index (BMI) 40.0 and over, adult: Secondary | ICD-10-CM

## 2021-03-27 DIAGNOSIS — I420 Dilated cardiomyopathy: Secondary | ICD-10-CM | POA: Diagnosis present

## 2021-03-27 DIAGNOSIS — Z20822 Contact with and (suspected) exposure to covid-19: Secondary | ICD-10-CM | POA: Diagnosis present

## 2021-03-27 DIAGNOSIS — Z8249 Family history of ischemic heart disease and other diseases of the circulatory system: Secondary | ICD-10-CM

## 2021-03-27 DIAGNOSIS — Z841 Family history of disorders of kidney and ureter: Secondary | ICD-10-CM

## 2021-03-27 DIAGNOSIS — Z887 Allergy status to serum and vaccine status: Secondary | ICD-10-CM

## 2021-03-27 DIAGNOSIS — Z90710 Acquired absence of both cervix and uterus: Secondary | ICD-10-CM

## 2021-03-27 DIAGNOSIS — Z825 Family history of asthma and other chronic lower respiratory diseases: Secondary | ICD-10-CM | POA: Diagnosis not present

## 2021-03-27 DIAGNOSIS — I248 Other forms of acute ischemic heart disease: Secondary | ICD-10-CM | POA: Diagnosis present

## 2021-03-27 DIAGNOSIS — R778 Other specified abnormalities of plasma proteins: Secondary | ICD-10-CM | POA: Diagnosis present

## 2021-03-27 DIAGNOSIS — Z95810 Presence of automatic (implantable) cardiac defibrillator: Secondary | ICD-10-CM | POA: Diagnosis not present

## 2021-03-27 DIAGNOSIS — I130 Hypertensive heart and chronic kidney disease with heart failure and stage 1 through stage 4 chronic kidney disease, or unspecified chronic kidney disease: Secondary | ICD-10-CM | POA: Diagnosis present

## 2021-03-27 DIAGNOSIS — R0789 Other chest pain: Secondary | ICD-10-CM | POA: Diagnosis not present

## 2021-03-27 DIAGNOSIS — F419 Anxiety disorder, unspecified: Secondary | ICD-10-CM | POA: Diagnosis present

## 2021-03-27 DIAGNOSIS — Z853 Personal history of malignant neoplasm of breast: Secondary | ICD-10-CM

## 2021-03-27 DIAGNOSIS — G4733 Obstructive sleep apnea (adult) (pediatric): Secondary | ICD-10-CM | POA: Diagnosis present

## 2021-03-27 DIAGNOSIS — Z87891 Personal history of nicotine dependence: Secondary | ICD-10-CM | POA: Diagnosis not present

## 2021-03-27 DIAGNOSIS — D72829 Elevated white blood cell count, unspecified: Secondary | ICD-10-CM | POA: Diagnosis present

## 2021-03-27 DIAGNOSIS — N179 Acute kidney failure, unspecified: Secondary | ICD-10-CM | POA: Diagnosis present

## 2021-03-27 DIAGNOSIS — N1831 Chronic kidney disease, stage 3a: Secondary | ICD-10-CM | POA: Diagnosis present

## 2021-03-27 DIAGNOSIS — I480 Paroxysmal atrial fibrillation: Principal | ICD-10-CM | POA: Diagnosis present

## 2021-03-27 DIAGNOSIS — Z882 Allergy status to sulfonamides status: Secondary | ICD-10-CM

## 2021-03-27 DIAGNOSIS — I444 Left anterior fascicular block: Secondary | ICD-10-CM | POA: Diagnosis present

## 2021-03-27 DIAGNOSIS — J449 Chronic obstructive pulmonary disease, unspecified: Secondary | ICD-10-CM | POA: Diagnosis present

## 2021-03-27 DIAGNOSIS — I493 Ventricular premature depolarization: Secondary | ICD-10-CM | POA: Insufficient documentation

## 2021-03-27 DIAGNOSIS — I428 Other cardiomyopathies: Secondary | ICD-10-CM

## 2021-03-27 DIAGNOSIS — Z888 Allergy status to other drugs, medicaments and biological substances status: Secondary | ICD-10-CM

## 2021-03-27 DIAGNOSIS — Z7901 Long term (current) use of anticoagulants: Secondary | ICD-10-CM | POA: Diagnosis not present

## 2021-03-27 DIAGNOSIS — R7989 Other specified abnormal findings of blood chemistry: Secondary | ICD-10-CM | POA: Diagnosis present

## 2021-03-27 DIAGNOSIS — R079 Chest pain, unspecified: Secondary | ICD-10-CM

## 2021-03-27 LAB — RESP PANEL BY RT-PCR (FLU A&B, COVID) ARPGX2
Influenza A by PCR: NEGATIVE
Influenza B by PCR: NEGATIVE
SARS Coronavirus 2 by RT PCR: NEGATIVE

## 2021-03-27 LAB — TROPONIN I (HIGH SENSITIVITY)
Troponin I (High Sensitivity): 65 ng/L — ABNORMAL HIGH (ref <18)
Troponin I (High Sensitivity): 610 ng/L (ref <18)

## 2021-03-27 LAB — CBC
HCT: 38.4 % (ref 36.0–46.0)
Hemoglobin: 12.4 g/dL (ref 12.0–15.0)
MCH: 34.2 pg — ABNORMAL HIGH (ref 26.0–34.0)
MCHC: 32.3 g/dL (ref 30.0–36.0)
MCV: 105.8 fL — ABNORMAL HIGH (ref 80.0–100.0)
Platelets: 235 10*3/uL (ref 150–400)
RBC: 3.63 MIL/uL — ABNORMAL LOW (ref 3.87–5.11)
RDW: 14.6 % (ref 11.5–15.5)
WBC: 13.5 10*3/uL — ABNORMAL HIGH (ref 4.0–10.5)
nRBC: 0.5 % — ABNORMAL HIGH (ref 0.0–0.2)

## 2021-03-27 LAB — COMPREHENSIVE METABOLIC PANEL
ALT: 21 U/L (ref 0–44)
AST: 33 U/L (ref 15–41)
Albumin: 3.7 g/dL (ref 3.5–5.0)
Alkaline Phosphatase: 55 U/L (ref 38–126)
Anion gap: 13 (ref 5–15)
BUN: 30 mg/dL — ABNORMAL HIGH (ref 8–23)
CO2: 20 mmol/L — ABNORMAL LOW (ref 22–32)
Calcium: 8.6 mg/dL — ABNORMAL LOW (ref 8.9–10.3)
Chloride: 106 mmol/L (ref 98–111)
Creatinine, Ser: 1.23 mg/dL — ABNORMAL HIGH (ref 0.44–1.00)
GFR, Estimated: 47 mL/min — ABNORMAL LOW (ref >60)
Glucose, Bld: 136 mg/dL — ABNORMAL HIGH (ref 70–99)
Potassium: 3.9 mmol/L (ref 3.5–5.1)
Sodium: 139 mmol/L (ref 135–145)
Total Bilirubin: 1.1 mg/dL (ref 0.3–1.2)
Total Protein: 6.8 g/dL (ref 6.5–8.1)

## 2021-03-27 LAB — MAGNESIUM: Magnesium: 2 mg/dL (ref 1.7–2.4)

## 2021-03-27 MED ORDER — HYDROCODONE-ACETAMINOPHEN 5-325 MG PO TABS
1.0000 | ORAL_TABLET | Freq: Two times a day (BID) | ORAL | Status: DC | PRN
  Administered 2021-03-27 – 2021-03-28 (×2): 1 via ORAL
  Filled 2021-03-27 (×2): qty 1

## 2021-03-27 MED ORDER — ALLOPURINOL 300 MG PO TABS
300.0000 mg | ORAL_TABLET | Freq: Every day | ORAL | Status: DC
  Administered 2021-03-28 – 2021-03-29 (×2): 300 mg via ORAL
  Filled 2021-03-27 (×3): qty 1

## 2021-03-27 MED ORDER — SACUBITRIL-VALSARTAN 97-103 MG PO TABS
1.0000 | ORAL_TABLET | Freq: Two times a day (BID) | ORAL | Status: DC
  Filled 2021-03-27 (×2): qty 1

## 2021-03-27 MED ORDER — ASPIRIN 81 MG PO CHEW
324.0000 mg | CHEWABLE_TABLET | Freq: Once | ORAL | Status: AC
  Administered 2021-03-27: 324 mg via ORAL
  Filled 2021-03-27: qty 4

## 2021-03-27 MED ORDER — APIXABAN 5 MG PO TABS
5.0000 mg | ORAL_TABLET | Freq: Two times a day (BID) | ORAL | Status: DC
  Administered 2021-03-27 – 2021-03-29 (×4): 5 mg via ORAL
  Filled 2021-03-27 (×4): qty 1

## 2021-03-27 MED ORDER — ACETAMINOPHEN 650 MG RE SUPP: 650.0000 mg | Freq: Four times a day (QID) | RECTAL | Status: DC | PRN

## 2021-03-27 MED ORDER — TAMOXIFEN CITRATE 10 MG PO TABS
20.0000 mg | ORAL_TABLET | Freq: Every day | ORAL | Status: DC
  Administered 2021-03-28 – 2021-03-29 (×2): 20 mg via ORAL
  Filled 2021-03-27 (×3): qty 2

## 2021-03-27 MED ORDER — ACETAMINOPHEN 325 MG PO TABS: 650.0000 mg | ORAL_TABLET | Freq: Four times a day (QID) | ORAL | Status: DC | PRN

## 2021-03-27 MED ORDER — METOPROLOL SUCCINATE ER 50 MG PO TB24
50.0000 mg | ORAL_TABLET | Freq: Every day | ORAL | Status: DC
  Administered 2021-03-28 – 2021-03-29 (×2): 50 mg via ORAL
  Filled 2021-03-27 (×2): qty 1

## 2021-03-27 MED ORDER — AMIODARONE HCL IN DEXTROSE 360-4.14 MG/200ML-% IV SOLN
60.0000 mg/h | INTRAVENOUS | Status: DC
  Administered 2021-03-27 (×2): 60 mg/h via INTRAVENOUS
  Filled 2021-03-27 (×3): qty 200

## 2021-03-27 MED ORDER — GABAPENTIN 400 MG PO CAPS
800.0000 mg | ORAL_CAPSULE | Freq: Three times a day (TID) | ORAL | Status: DC
  Administered 2021-03-28 – 2021-03-29 (×4): 800 mg via ORAL
  Filled 2021-03-27 (×4): qty 2

## 2021-03-27 MED ORDER — ATORVASTATIN CALCIUM 20 MG PO TABS
40.0000 mg | ORAL_TABLET | Freq: Every day | ORAL | Status: DC
  Administered 2021-03-28 – 2021-03-29 (×2): 40 mg via ORAL
  Filled 2021-03-27 (×2): qty 2

## 2021-03-27 MED ORDER — SPIRONOLACTONE 25 MG PO TABS
25.0000 mg | ORAL_TABLET | Freq: Every day | ORAL | Status: DC
  Filled 2021-03-27: qty 1

## 2021-03-27 MED ORDER — FUROSEMIDE 20 MG PO TABS: 20.0000 mg | ORAL_TABLET | Freq: Every day | ORAL | Status: DC

## 2021-03-27 MED ORDER — AMIODARONE LOAD VIA INFUSION
150.0000 mg | Freq: Once | INTRAVENOUS | Status: AC
  Administered 2021-03-27: 150 mg via INTRAVENOUS
  Filled 2021-03-27: qty 83.34

## 2021-03-27 MED ORDER — AMIODARONE HCL IN DEXTROSE 360-4.14 MG/200ML-% IV SOLN
30.0000 mg/h | INTRAVENOUS | Status: DC
  Administered 2021-03-27 – 2021-03-29 (×4): 30 mg/h via INTRAVENOUS
  Filled 2021-03-27 (×3): qty 200

## 2021-03-27 MED ORDER — ALBUTEROL SULFATE HFA 108 (90 BASE) MCG/ACT IN AERS
2.0000 | INHALATION_SPRAY | Freq: Four times a day (QID) | RESPIRATORY_TRACT | Status: DC | PRN
  Filled 2021-03-27: qty 6.7

## 2021-03-27 MED ORDER — TIOTROPIUM BROMIDE MONOHYDRATE 18 MCG IN CAPS
18.0000 ug | ORAL_CAPSULE | Freq: Every day | RESPIRATORY_TRACT | Status: DC
  Administered 2021-03-28 – 2021-03-29 (×2): 18 ug via RESPIRATORY_TRACT
  Filled 2021-03-27: qty 5

## 2021-03-27 MED ORDER — METOPROLOL TARTRATE 25 MG PO TABS
25.0000 mg | ORAL_TABLET | Freq: Once | ORAL | Status: AC
  Administered 2021-03-27: 25 mg via ORAL
  Filled 2021-03-27: qty 1

## 2021-03-27 NOTE — ED Notes (Signed)
Pt eating lunch tray at this time  

## 2021-03-27 NOTE — ED Notes (Signed)
Pt reports pain in breast has been relieved

## 2021-03-27 NOTE — H&P (Signed)
History and Physical    PLEASE NOTE THAT DRAGON DICTATION SOFTWARE WAS USED IN THE CONSTRUCTION OF THIS NOTE.   Sophia Andrews NUU:[id number] DOB: 11-15-1951 DOA: 03/27/2021  PCP: Christen Bame, DO Patient coming from: home   I have personally briefly reviewed patient's old medical records in [place]  Chief Complaint: ICD shocks  HPI: Sophia Andrews is a 70 y.o. female with medical history significant for chronic combined heart failure with echo in Sept '20 demonstrating LVEF 30 to 35% with evidence of diastolic dysfunction status post ICD, essential pretension, COPD, who is admitted to [facility] on 03/27/2021 with new diagnosis of atrial fibrillation after presenting from home to [facility] ED complaining of receiving multiple ICD shocks.   The patient reports that she awoke in her normal state of health this morning, before subsequently receiving a total of her ICD while at home, prompting her to present to [facility] ED for further evaluation.  Prior to the first ICD shock, she denies any acute symptoms, including no chest pain.  She she now reports mild left lateral chest wall soreness, without radiation, that is reproducible with palpation of the left anterior chest wall.  She notes that this sore sensation has been constant following the ICD shocks, but notes a persistent trend down in terms of the associated intensity.  She denies any associated shortness of breath, palpitations, diaphoresis, dizziness, presyncope, or syncope.  She also denies any recent subjective fever, chills, rigors, or generalized myalgias.  No recent abdominal pain, diarrhea, melena, hematochezia, or rash.  Not associated with any recent dysuria, gross hematuria, or change in urinary urgency/frequency.  No recent known COVID-19 exposures.  The patient reports that she was also at [facility] ED 2 weeks ago after she received 2 ICD shocks at home at that time.  Does not believe that she has  received any shocks in the interval leading up to today's incident received 4 shocks, as above.  She has a history of chronic combined systolic/diastolic heart failure, with most recent echocardiogram in September 2020 showing LVEF 30 to 35%, no LVH, global hypokinesis, positive diastolic dysfunction, mildly dilated left atrium, and mild mitral regurgitation.  She is on Lasix 20 mg p.o. daily at home.  Additionally, her chronic heart failure regimen includes Toprol-XL, Entresto, spironolactone, with which she reports outstanding compliance.  She denies any known history of prior atrial fibrillation, and chart review reveals no documentation of a prior history of this.     ED Course:  Vital signs in the ED were notable for the following: Temperature max 98.3, heart rate 84-93, blood pressure 104/60 -123/87; respiratory rate 15-19; oxygen saturation 95 to 96% on room air.   Labs were notable for the following: CMP was notable for the following: Sodium 139, potassium 3.9 BUN 30, creatinine 1.23 relative to most recent prior creatinine value 0.88 on 03/08/2021.  Magnesium 2.0.  CBC notable for white blood cell count 13,500.  High-sensitivity troponin I initially found to be 65, with interval increase to 610, which is relative to most recent prior value of 270 after receiving 2 ICD shocks on 03/08/2021.  Nasopharyngeal COVID-19 PCR screen was performed in the ED today, and found to be negative.   Presenting EKG showed sinus tachycardia with multiple PVCs, left anterior fascicular block, ventricular rate 94, nonspecific T wave inversion in V1, no evidence of ST changes, including no evidence of ST elevation.  Chest x-Sophia showed no evidence of acute cardiopulmonary process, including  no evidence infiltrate, edema, effusion, or pneumothorax.  The patient's case was discussed with the on-call cardiologist, Dr. Rockey Situ, who evaluated the patient in the ED alongside the patient's ICD device representative.  At this  time, device was interrogated, revealing a total of 4 shocks this morning in the setting of atrial fibrillation, without evidence of ventricular tachycardia.  Consequently, Dr.Gollan recommended initiation of amiodarone drip, cardiology will continue to follow.   While in the ED, the following were administered: Full dose aspirin x1, Lopressor 25 mg p.o. x1, and amiodarone drip was initiated.     Review of Systems: As per HPI otherwise 10 point review of systems negative.   Past Medical History:  Diagnosis Date  . Cancer ([place])   . Cardiac defibrillator in place   . CHF (congestive heart failure) ([place])   . COPD (chronic obstructive pulmonary disease) ([place])   . Hypertension   . OSA (obstructive sleep apnea)   . Pacemaker     Past Surgical History:  Procedure Laterality Date  . ABDOMINAL HYSTERECTOMY    . JOINT REPLACEMENT    . TONSILLECTOMY      Social History:  reports that she quit smoking about 11 years ago. She has never used smokeless tobacco. She reports previous alcohol use. She reports that she does not use drugs.   Allergies  Allergen Reactions  . Sulfa Antibiotics Rash and Shortness Of Breath  . Ambien [Zolpidem Tartrate]   . Lyrica [Pregabalin]   . Tetanus Toxoids     Family History  Problem Relation Age of Onset  . Asthma Mother   . Heart disease Mother   . Kidney disease Mother   . Pancreatic cancer Father       Prior to Admission medications   Medication Sig Start Date End Date Taking? Authorizing Provider  albuterol (VENTOLIN HFA) 108 (90 Base) MCG/ACT inhaler Inhale 2 puffs into the lungs every 6 (six) hours as needed for shortness of breath or wheezing. 11/26/19   Chase Picket, MD  allopurinol (ZYLOPRIM) 300 MG tablet Take 300 mg by mouth daily.     [provider]  atorvastatin (LIPITOR) 40 MG tablet Take 40 mg by mouth daily.    [provider]  diclofenac sodium (VOLTAREN) 1 % GEL Apply 2 g topically 4 (four) times daily.      [provider]  DULoxetine (CYMBALTA) 60 MG capsule Take 120 mg by mouth daily.     [provider]  furosemide (LASIX) 20 MG tablet Take 20 mg by mouth.     [provider]  gabapentin (NEURONTIN) 400 MG capsule Take 800 mg by mouth 3 (three) times daily.     [provider]  HYDROcodone-acetaminophen (NORCO/VICODIN) 5-325 MG tablet Take 1 tablet by mouth 2 (two) times daily as needed for pain. 06/22/19   [provider]  magnesium oxide (MAG-OX) 400 MG tablet Take 400 mg by mouth daily.    [provider]  metoprolol succinate (TOPROL-XL) 50 MG 24 hr tablet Take 50 mg by mouth daily.     [provider]  Multiple Vitamin (MULTIVITAMIN) LIQD Take 5 mLs by mouth daily.    [provider]  naphazoline-glycerin (CLEAR EYES) 0.012-0.2 % SOLN 1-2 drops 4 (four) times daily as needed for eye irritation.    [provider]  predniSONE (STERAPRED UNI-PAK 21 TAB) 10 MG (21) TBPK tablet Take by mouth daily. 60 mg x 1 day, 50 mg x 1 day, 40 mg x 1  day, 30 mg x 1 day, 20 mg x 1 day, 10 mg x 1 day 11/26/19   Chase Picket, MD  sacubitril-valsartan (ENTRESTO) 97-103 MG Take 1 tablet by mouth 2 (two) times daily. 12/11/18   [provider]  spironolactone (ALDACTONE) 25 MG tablet Take 25 mg by mouth daily.    [provider]  tamoxifen (NOLVADEX) 20 MG tablet Take 20 mg by mouth daily.    [provider]  tiotropium (SPIRIVA) 18 MCG inhalation capsule Place 18 mcg into inhaler and inhale daily.    [provider]     Objective    Physical Exam: Vitals:   03/27/21 0803 03/27/21 1011 03/27/21 1100 03/27/21 1130  BP: 115/68 104/60 108/76 (!) 94/54  Pulse: 93 (!) 40 88 (!) 46  Resp: 15 15 19 18   Temp: 98.3 F (36.8 C)     SpO2: 94% 95% 95% 96%    General: appears to be stated age; alert, oriented Skin: warm, dry, no rash Head:  AT/[place] Mouth:  Oral mucosa membranes appear moist,  normal dentition Neck: supple; trachea midline Heart:  Irregular, but rate conrolled; did not appreciate any M/R/G Lungs: CTAB, did not appreciate any wheezes, rales, or rhonchi Abdomen: + BS; soft, ND, NT Vascular: 2+ pedal pulses b/l; 2+ radial pulses b/l Extremities: no peripheral edema, no muscle wasting Neuro: strength and sensation intact in upper and lower extremities b/l    Labs on Admission: I have personally reviewed following labs and imaging studies  CBC: Recent Labs  Lab 03/27/21 0812  WBC 13.5*  HGB 12.4  HCT 38.4  MCV 105.8*  PLT 867   Basic Metabolic Panel: Recent Labs  Lab 03/27/21 0812  NA 139  K 3.9  CL 106  CO2 20*  GLUCOSE 136*  BUN 30*  CREATININE 1.23*  CALCIUM 8.6*  MG 2.0   GFR: CrCl cannot be calculated (Unknown ideal weight.). Liver Function Tests: Recent Labs  Lab 03/27/21 0812  AST 33  ALT 21  ALKPHOS 55  BILITOT 1.1  PROT 6.8  ALBUMIN 3.7   No results for input(s): LIPASE, AMYLASE in the last 168 hours. No results for input(s): AMMONIA in the last 168 hours. Coagulation Profile: No results for input(s): INR, PROTIME in the last 168 hours. Cardiac Enzymes: No results for input(s): CKTOTAL, CKMB, CKMBINDEX, TROPONINI in the last 168 hours. BNP (last 3 results) No results for input(s): PROBNP in the last 8760 hours. HbA1C: No results for input(s): HGBA1C in the last 72 hours. CBG: No results for input(s): GLUCAP in the last 168 hours. Lipid Profile: No results for input(s): CHOL, HDL, LDLCALC, TRIG, CHOLHDL, LDLDIRECT in the last 72 hours. Thyroid Function Tests: No results for input(s): TSH, T4TOTAL, FREET4, T3FREE, THYROIDAB in the last 72 hours. Anemia Panel: No results for input(s): VITAMINB12, FOLATE, FERRITIN, TIBC, IRON, RETICCTPCT in the last 72 hours. Urine analysis:    Component Value Date/Time   COLORURINE YELLOW 10/30/2019 1318   APPEARANCEUR CLEAR 10/30/2019 1318   LABSPEC 1.015 10/30/2019 1318   PHURINE  5.5 10/30/2019 1318   GLUCOSEU NEGATIVE 10/30/2019 1318   HGBUR NEGATIVE 10/30/2019 1318   [place] 10/30/2019 1318   KETONESUR NEGATIVE 10/30/2019 1318   PROTEINUR NEGATIVE 10/30/2019 1318   NITRITE NEGATIVE 10/30/2019 1318   LEUKOCYTESUR TRACE (A) 10/30/2019 1318    Radiological Exams on Admission: DG Chest Portable 1 View  Result Date: 03/27/2021 CLINICAL DATA:  70 year old female with chest pain. EXAM: PORTABLE CHEST 1 VIEW  COMPARISON:  Chest radiographs 10/30/2019 and earlier. FINDINGS: Portable AP semi upright view at 0820 hours. Cardiac size is stable and at the upper limits of normal. Stable left chest AICD. Other mediastinal contours are within normal limits. Visualized tracheal air column is within normal limits. Stable lung volumes. Allowing for portable technique the lungs are clear. No pneumothorax or pleural effusion. No acute osseous abnormality identified. IMPRESSION: No acute cardiopulmonary abnormality. Electronically Signed   By: Odessa Fleming M.D.   On: 03/27/2021 09:26     EKG: Independently reviewed, with result as described above.    Assessment/Plan   Sophia GIOVANNONI is a 70 y.o. female with medical history significant for chronic combined heart failure with echo in Sept '20 demonstrating LVEF 30 to 35% with evidence of diastolic dysfunction status post ICD, essential pretension, COPD, who is admitted to [facility] on 03/27/2021 with new diagnosis of atrial fibrillation after presenting from home to [facility] ED complaining of receiving multiple ICD shocks.    Principal Problem:   AF (paroxysmal atrial fibrillation) (HCC) Active Problems:   Chronic combined systolic (congestive) and diastolic (congestive) heart failure (HCC)   AKI (acute kidney injury) (HCC)   COPD (chronic obstructive pulmonary disease) (HCC)   HTN (hypertension)   Defibrillator discharge   Atypical chest pain   Elevated troponin   Leukocytosis    #) Paroxysmal  atrial fibrillation: Per interrogation performed by ICD device representative in the presence of on-call cardiologist, the patient has received a total of 4 shocks this morning in the setting of paroxysmal atrial fibrillation, with no prior known history of atrial fibrillation.  In the context of this new diagnosis and associated receipt of 4 shocks via ICD in the absence of any interrogation evidence of ventricular tachycardia, the on-call cardiologist, Dr. Mariah Milling, has been consulted, and recommends initiation of amiodarone drip.  Blood pressure has been tolerating atrial fibrillation as well as initiation of amiodarone drip.  In the setting of a CHA2DS2-VASc score of 5, there is an indication for the patient to be on chronic anticoagulation for thromboembolic prophylaxis.  She is not currently on any blood thinning agents at home, and further discussions regarding the indications, risks, and benefits of initiation of chronic anticoagulation in the setting of paroxysmal atrial fibrillation are anticipated during this hospitalization.  Current home AV nodal blocking regimen consists of Toprol-XL.  Most recent echocardiogram was performed in September 2020, with results as described above.    Plan: monitor strict I's & O's and daily weights. Repeat BMP and CBC in the morning. Check serum magnesium level.  Cardiology formally consulted, as above.  Continue amiodarone drip, per cardiology recommendation.  Continue home beta-blocker.  Monitor on telemetry.        #) Elevated troponin: Initial troponin noted to be 65, with repeat value trending up to 610.  This appears to be as a consequence of the patient receiving 4 ICD shocks earlier today, with ensuing interrogation of her ICD demonstrating atrial fibrillation in the absence of any evidence of ventricular tachycardia or fibrillation.  Overall, ACS appears less likely at this time in the absence of any atypical chest pain, while presenting EKG shows no  evidence of acute ischemic changes.  We will continue to trend serial troponin values, will pursue evaluation and management of new diagnosis of paroxysmal atrial fibrillation, as above.  Plan: Monitor on telemetry.  Repeat troponin level at 1600.  Monitor continuous pulse oximetry.  Evaluation management of presenting paroxysmal atrial fibrillation,  as above.  Continue beta-blocker.      #) Leukocytosis: Presenting CBC reflects mildly elevated white blood cell count, with suspected contribution from inflammatory factors as a consequence of the patient receiving 4 ICD shocks earlier today.  No clinical evidence of underlying infectious process at this time, including a negative nasopharyngeal COVID-19 PCR was performed in the ED today.  Additionally, chest x-Sophia showed no evidence of acute cardiopulmonary process.  Will check urinalysis to further evaluate for any underlying infectious process.  Plan: Check urinalysis, as above.  Repeat CBC in the morning.      #) Acute kidney injury: Presenting serum creatinine noted to be 1.23 relative to 0.88 on 03/08/2021.  Suspect that this is related to 4 ICD shocks administered earlier today.  We will check urinalysis with close attention for the presence of urinary casts.  Of note, medication list includes gabapentin, which undergoes 100% renal clearance of metabolites.  Consequently, will hold home gabapentin for now until renal function demonstrates improvement.  Plan: Urinalysis with microscopy, as above.  Monitor strict I's and O's and daily weights.  Tempt avoid nephrotoxic agents.  Add on random urine sodium as well as random urine creatinine.  Repeat BMP in the morning.  Hold home gabapentin for now, as above.      #) Essential hypertension: Outpatient hypertensive regimen includes Lasix, Toprol-XL, Entresto, and spironolactone.  Systolic blood pressures have been found to be normotensive in the ED.  Plan: Continue home antihypertensive  regimen.  Close monitoring of ensuing blood pressure via routine vital signs.       #) COPD: Respiratory regimen consists of as needed albuterol as well as Breo, with which the patient reports good compliance.  She confirms that she is a former smoker, having completely quit smoking in 2010, without subsequent resumption.  No evidence to suggest acute exacerbation of her underlying COPD.  Plan: Continue home respiratory regimen.      #) Chronic combined systolic/diastolic heart failure: Documented history of such, with most recent echocardiogram in September 2020, with results as documented above.  Status postplacement of ICD.  Outpatient heart failure regimen consists of Entresto, spironolactone, Toprol-XL, and Lasix no clinical or radiographic evidence to suggest acutely decompensated heart failure at this time.  Plan: Continue home heart failure regimen, as above.  Monitor strict I's and O's and daily weights.  Monitor on telemetry.  Repeat BMP in the morning.  Add on serum magnesium level.     DVT prophylaxis: scd's  Code Status: Full code Family Communication: Case was discussed with the patient's daughter, who is present at bedside Disposition Plan: Per Rounding Team Consults called: case discussed with on-call cardiologist, Dr. Rockey Situ, who has been formally consulted, as further described above Admission status: Inpatient; PCU     Of note, this patient was added by me to the following Admit List/Treatment Team: [email or web address].      PLEASE NOTE THAT DRAGON DICTATION SOFTWARE WAS USED IN THE CONSTRUCTION OF THIS NOTE.   [place] [facility] Pager [phone] From 12PM - 12AM  Otherwise, please contact night-coverage  [email or web address] Password [facility]   03/27/2021, 12:45 PM

## 2021-03-27 NOTE — Progress Notes (Signed)
Brief note regarding plan, with full H&P to follow:  70 year old female with history of chronic combined systolic/diastolic heart failure status post ICD placement, who is admitted today with new onset atrial fibrillation after presenting to [facility] ED complaining of multiple ICD discharges earlier today.  The patient's case was discussed with the on-call cardiologist, Dr. Rockey Situ, who evaluated the patient in the ED alongside the patient's ICD device representative.  At this time, device was interrogated, revealing a total of 4 shocks this morning in the setting of atrial fibrillation, without evidence of ventricular tachycardia.  Consequently, Dr.Gollan recommended initiation of amiodarone drip, cardiology will continue to follow.     Babs Bertin, DO Hospitalist

## 2021-03-27 NOTE — ED Notes (Signed)
Pt medicated per [facility], pacemaker has been interrogated. Visitor remains at bedside, pt watching tv at this time. AO x4, breathing is regular and unlabored

## 2021-03-27 NOTE — ED Notes (Signed)
Pt in and out of bigeminy, MD Quentin Cornwall made aware

## 2021-03-27 NOTE — ED Notes (Signed)
Admitting physician at bedside

## 2021-03-27 NOTE — ED Notes (Signed)
Pt refusing ASA, states pcp told her to "not take aspirin and to take tylenol." RN educated pt that is in relation to pain and that the aspirin is to help avoid clots. Pt remains unsure of taking medication and currently refusing. MD Quentin Cornwall made aware. Pt has family at bedside. Awaiting inpatient ready bed

## 2021-03-27 NOTE — ED Triage Notes (Signed)
Pt to ED via EMS for firing of defibrillator x4, denies cp, lightheaded. Dizziness, or sob. Reports she now has 4/10 L breast pain and 5/10  headache. Reports small amount of dizziness post firing of defibrillator. Denies double vision. AO x4. Talking in full sentences with regular and unlabored breathing.

## 2021-03-27 NOTE — ED Notes (Signed)
[facility] for consult [phone]

## 2021-03-27 NOTE — ED Notes (Signed)
[employer] rep called- he will be here in approx 30 mins to do a interrogation

## 2021-03-27 NOTE — ED Provider Notes (Signed)
[facility] Emergency Department Provider Note    Event Date/Time   First MD Initiated Contact with Patient 03/27/21 [phone]     (approximate)  I have reviewed the triage vital signs and the nursing notes.   HISTORY  Chief Complaint Chest Pain    HPI Sophia Andrews is a 70 y.o. female below listed past medical history is reports to the ER via EMS after her defibrillator went off 4 times this morning roughly 30 minutes prior to EMS arrival.  She is denying any chest pain or pressure.  Did not take her morning medications.  Was just recently seen for similar presentation few days ago.  She is denying any shortness of breath or chest pain at rest.  No fevers.    Past Medical History:  Diagnosis Date  . Cancer ([place])   . Cardiac defibrillator in place   . CHF (congestive heart failure) ([place])   . COPD (chronic obstructive pulmonary disease) ([place])   . Hypertension   . OSA (obstructive sleep apnea)   . Pacemaker    Family History  Problem Relation Age of Onset  . Asthma Mother   . Heart disease Mother   . Kidney disease Mother   . Pancreatic cancer Father    Past Surgical History:  Procedure Laterality Date  . ABDOMINAL HYSTERECTOMY    . JOINT REPLACEMENT    . TONSILLECTOMY     Patient Active Problem List   Diagnosis Date Noted  . 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 AF (paroxysmal atrial fibrillation) ([place]) 03/27/2021  . Defibrillator discharge 03/27/2021  . Atypical chest pain 03/27/2021  . Elevated troponin 03/27/2021  . Leukocytosis 03/27/2021  . Severe sepsis ([place]) 08/15/2019  . UTI (urinary tract infection) 08/15/2019  . Chronic combined systolic (congestive) and diastolic (congestive) heart failure ([place]) 08/15/2019  . AKI (acute kidney injury) ([place]) 08/15/2019  . COPD (chronic obstructive pulmonary disease) ([place]) 08/15/2019  . HTN (hypertension) 08/15/2019  . OSA (obstructive sleep apnea) 08/15/2019      Prior to Admission medications    Medication Sig Start Date End Date Taking? Authorizing Provider  acetaminophen (TYLENOL) 650 MG CR tablet Take 1,300 mg by mouth 3 (three) times daily. 04/25/20  Yes [provider]  albuterol (VENTOLIN HFA) 108 (90 Base) MCG/ACT inhaler Inhale 2 puffs into the lungs every 6 (six) hours as needed for shortness of breath or wheezing. 11/26/19  Yes Lamptey, Myrene Galas, MD  allopurinol (ZYLOPRIM) 300 MG tablet Take 300 mg by mouth daily.    Yes [provider]  atorvastatin (LIPITOR) 40 MG tablet Take 40 mg by mouth daily.   Yes [provider]  cetirizine (ZYRTEC) 10 MG tablet Take 10 mg by mouth daily. 02/24/21  Yes [provider]  DULoxetine (CYMBALTA) 60 MG capsule Take 120 mg by mouth daily.    Yes [provider]  ELIQUIS 5 MG TABS tablet Take 1 tablet by mouth 2 (two) times daily. 03/20/21  Yes [provider]  furosemide (LASIX) 20 MG tablet Take 20 mg by mouth.    Yes [provider]  gabapentin (NEURONTIN) 400 MG capsule Take 800 mg by mouth 3 (three) times daily.    Yes [provider]  HYDROcodone-acetaminophen (NORCO/VICODIN) 5-325 MG tablet Take 1 tablet by mouth 2 (two) times daily as needed for pain. 06/22/19  Yes [provider]  magnesium oxide (MAG-OX) 400 MG tablet Take 400 mg by mouth daily.   Yes [provider]  metoprolol succinate (TOPROL-XL) 50 MG 24 hr tablet Take 50 mg by mouth daily.    Yes [provider]  Multiple Vitamins-Minerals (ONE-A-DAY WOMENS 50 PLUS) TABS Take 1 tablet by mouth daily.   Yes [provider]  naphazoline-glycerin (CLEAR EYES) 0.012-0.2 % SOLN 1-2 drops 4 (four) times daily as needed for eye irritation.   Yes [provider]  olopatadine (PATANOL) 0.1 % ophthalmic solution Place 1 drop into both eyes in the morning and at bedtime. 01/23/21 01/23/22 Yes [provider]  sacubitril-valsartan (ENTRESTO) 97-103 MG Take 1 tablet by mouth  2 (two) times daily. 12/11/18  Yes [provider]  [place] 2.5-2.5 MCG/ACT AERS Take 2 puffs by mouth daily. 02/24/21  Yes [provider]  tamoxifen (NOLVADEX) 20 MG tablet Take 20 mg by mouth daily.   Yes [provider]  tiotropium (SPIRIVA) 18 MCG inhalation capsule Place 18 mcg into inhaler and inhale daily.   Yes [provider]  diclofenac sodium (VOLTAREN) 1 % GEL Apply 2 g topically 4 (four) times daily.  Patient not taking: No sig reported    [provider]  Multiple Vitamin (MULTIVITAMIN) LIQD Take 5 mLs by mouth daily. Patient not taking: No sig reported    [provider]  predniSONE (STERAPRED UNI-PAK 21 TAB) 10 MG (21) TBPK tablet Take by mouth daily. 60 mg x 1 day, 50 mg x 1 day, 40 mg x 1 day, 30 mg x 1 day, 20 mg x 1 day, 10 mg x 1 day Patient not taking: No sig reported 11/26/19   Chase Picket, MD  spironolactone (ALDACTONE) 25 MG tablet Take 25 mg by mouth daily. Patient not taking: No sig reported    [provider]    Allergies Sulfa antibiotics, Ambien [zolpidem tartrate], Lyrica [pregabalin], and Tetanus toxoids    Social History Social History   Tobacco Use  . Smoking status: Former Smoker    Quit date: 10/29/2009    Years since quitting: 11.4  . Smokeless tobacco: Never Used  Vaping Use  . Vaping Use: Never used  Substance Use Topics  . Alcohol use: Not Currently  . Drug use: Never    Review of Systems Patient denies headaches, rhinorrhea, blurry vision, numbness, shortness of breath, chest pain, edema, cough, abdominal pain, nausea, vomiting, diarrhea, dysuria, fevers, rashes or hallucinations unless otherwise stated above in HPI. ____________________________________________   PHYSICAL EXAM:  VITAL SIGNS: Vitals:   03/27/21 1130 03/27/21 1400  BP: (!) 94/54 95/68  Pulse: (!) 46 (!) 40  Resp: 18 18  Temp:    SpO2: 96% 96%    Constitutional: Alert and oriented.   Eyes: Conjunctivae are normal.  Head: Atraumatic. Nose: No congestion/rhinnorhea. Mouth/Throat: Mucous membranes are moist.   Neck: No stridor. Painless ROM.  Cardiovascular: Normal rate, regular rhythm. Grossly normal heart sounds.  Good peripheral circulation. Respiratory: Normal respiratory effort.  No retractions. Lungs CTAB. Gastrointestinal: Soft and nontender. No distention. No abdominal bruits. No CVA tenderness. Genitourinary:  Musculoskeletal: No lower extremity tenderness nor edema.  No joint effusions. Neurologic:  Normal speech and language. No gross focal neurologic deficits are appreciated. No facial droop Skin:  Skin is warm, dry and intact. No rash noted. Psychiatric: Mood and affect are normal. Speech and behavior are normal.  ____________________________________________   LABS (all labs ordered are listed, but only abnormal results are displayed)  Results for orders placed or performed during the hospital encounter of 03/27/21 (from the past 24 hour(s))  CBC     Status: Abnormal   Collection Time: 03/27/21  8:12 AM  Result Value Ref Range   WBC 13.5 (H) 4.0 - 10.5 K/uL   RBC 3.63 (L) 3.87 - 5.11 MIL/uL   Hemoglobin 12.4 12.0 - 15.0 g/dL   HCT 38.4 36.0 - 46.0 %   MCV 105.8 (H) 80.0 - 100.0 fL   MCH 34.2 (H) 26.0 - 34.0 pg   MCHC 32.3 30.0 - 36.0 g/dL   RDW 14.6 11.5 - 15.5 %   Platelets 235 150 - 400 K/uL   nRBC 0.5 (H) 0.0 - 0.2 %  Comprehensive metabolic panel     Status: Abnormal   Collection Time: 03/27/21  8:12 AM  Result Value Ref Range   Sodium 139 135 - 145 mmol/L   Potassium 3.9 3.5 - 5.1 mmol/L   Chloride 106 98 - 111 mmol/L   CO2 20 (L) 22 - 32 mmol/L   Glucose, Bld 136 (H) 70 - 99 mg/dL   BUN 30 (H) 8 - 23 mg/dL   Creatinine, Ser 1.23 (H) 0.44 - 1.00 mg/dL   Calcium 8.6 (L) 8.9 - 10.3 mg/dL   Total Protein 6.8 6.5 - 8.1 g/dL   Albumin 3.7 3.5 - 5.0 g/dL   AST 33 15 - 41 U/L   ALT 21 0 - 44 U/L   Alkaline Phosphatase 55 38 - 126 U/L    Total Bilirubin 1.1 0.3 - 1.2 mg/dL   GFR, Estimated 47 (L) >60 mL/min   Anion gap 13 5 - 15  Troponin I (High Sensitivity)     Status: Abnormal   Collection Time: 03/27/21  8:12 AM  Result Value Ref Range   Troponin I (High Sensitivity) 65 (H) <18 ng/L  Magnesium     Status: None   Collection Time: 03/27/21  8:12 AM  Result Value Ref Range   Magnesium 2.0 1.7 - 2.4 mg/dL  Troponin I (High Sensitivity)     Status: Abnormal   Collection Time: 03/27/21 10:10 AM  Result Value Ref Range   Troponin I (High Sensitivity) 610 (HH) <18 ng/L  Resp Panel by RT-PCR (Flu A&B, Covid) Nasopharyngeal Swab     Status: None   Collection Time: 03/27/21 10:10 AM   Specimen: Nasopharyngeal Swab; Nasopharyngeal(NP) swabs in vial transport medium  Result Value Ref Range   SARS Coronavirus 2 by RT PCR NEGATIVE NEGATIVE   Influenza A by PCR NEGATIVE NEGATIVE   Influenza B by PCR NEGATIVE NEGATIVE   ____________________________________________  EKG My review and personal interpretation at Time: 8:07   Indication: chest pain  Rate: 95  Rhythm: sinus Axis: normal Other: normal intervals, no stemi ____________________________________________  RADIOLOGY  I personally reviewed all radiographic images ordered to evaluate for the above acute complaints and reviewed radiology reports and findings.  These findings were personally discussed with the patient.  Please see medical record for radiology report.  ____________________________________________   PROCEDURES  Procedure(s) performed:  .Critical Care Performed by: Merlyn Lot, MD Authorized by: Merlyn Lot, MD   Critical care provider statement:    Critical care time (minutes):  35   Critical care time was exclusive of:  Separately billable procedures and treating other patients   Critical care was necessary to treat or prevent imminent or life-threatening deterioration of the following conditions:  Circulatory failure   Critical care  was time spent personally by me on the following activities:  Development of treatment plan with patient or surrogate, discussions with consultants,  evaluation of patient's response to treatment, examination of patient, obtaining history from patient or surrogate, ordering and performing treatments and interventions, ordering and review of laboratory studies, ordering and review of radiographic studies, pulse oximetry, re-evaluation of patient's condition and review of old [place] performed: yes ____________________________________________   INITIAL IMPRESSION / ASSESSMENT AND PLAN / ED COURSE  Pertinent labs & imaging results that were available during my care of the patient were reviewed by me and considered in my medical decision making (see chart for details).   DDX: .ACS, pericarditis, esophagitis, boerhaaves, pe, dissection, pna, bronchitis, costochondritis    MICHONNE BETANZOS is a 70 y.o. who presents to the ED with presentation as described above currently nontoxic-appearing hemodynamically stable here after multiple defibrillator firing.  The patient will be placed on continuous pulse oximetry and telemetry for monitoring.  Laboratory evaluation will be sent to evaluate for the above complaints.     Clinical Course as of 03/27/21 1522  Fri Mar 27, 2021  1054 Repeat troponin significantly elevated.  May be secondary to defibrillations.  I have consulted cardiology at [facility] seeking further recommendations.  She is describing some mild left-sided chest pain therefore will heparinize and give aspirin. [PR]    Clinical Course User Index [PR] Merlyn Lot, MD   Consulted with Dr. At [facility] did not have any further recommendations for treatment.  Given her rising troponin and frequent defibrillators discussed case with cardiology at this facility as well as consultation with hospitalist for admission.  Interrogation of pacer defibrillator shows evidence of  underlying A. fib should be started on amiodarone loading infusion.  Stable for admission.  The patient was evaluated in Emergency Department today for the symptoms described in the history of present illness. He/she was evaluated in the context of the global COVID-19 pandemic, which necessitated consideration that the patient might be at risk for infection with the SARS-CoV-2 virus that causes COVID-19. Institutional protocols and algorithms that pertain to the evaluation of patients at risk for COVID-19 are in a state of rapid change based on information released by regulatory bodies including the CDC and federal and state organizations. These policies and algorithms were followed during the patient's care in the ED.  As part of my medical decision making, I reviewed the following data within the [place] notes reviewed and incorporated, Labs reviewed, notes from prior ED visits and  Controlled Substance Database   ____________________________________________   FINAL CLINICAL IMPRESSION(S) / ED DIAGNOSES  Final diagnoses:  Nonspecific chest pain  Defibrillator discharge      NEW MEDICATIONS STARTED DURING THIS VISIT:  New Prescriptions   No medications on file     Note:  This document was prepared using Dragon voice recognition software and may include unintentional dictation errors.    Merlyn Lot, MD 03/27/21 [phone]

## 2021-03-27 NOTE — Consult Note (Signed)
Cardiology Consultation:   Patient ID: Sophia Andrews MRN: [id number]; DOB: 04/05/1951  Admit date: 03/27/2021 Date of Consult: 03/27/2021  PCP:  Christen Bame, DO   [place]  Cardiologist: [facility] cardiology, new to [facility] Physician requesting consult: Merlyn Lot Reason for consult: ICD shock x4  Patient Profile:   Sophia Andrews is a 70 y.o. female with a hx of dilated nonischemic cardiomyopathy, secondary to breast cancer, status post lumpectomy, chronic shortness of breath/COPD chemoradiation, obstructive sleep apnea, morbid obesity presenting after ICD shock x4.  History of Present Illness:   Ms. Dizdarevic reports she was in her usual state of health this morning when she was woken by a shock x4.  Shocks were clustered relatively close together, denied any tachypalpitations, no near syncope or syncope.  Download reviewed with [employer] rep detailing episodes of atrial fibrillation with RVR accounting for all 4 episodes it would appear.  Also prior episodes in March 08, 2021 reviewed concerning for atrial fibrillation with shock delivered.  Reports she is always short of breath, nothing worse recently Denies any worsening swelling, abdominal distention, no PND orthopnea Denies any chest pain concerning for angina  Prior ICD shock x2 March 08, 2021, presented to [facility] emergency room At ICD download in the emergency room, interpreted by the emergency room physician, felt to have VT with shock .  Minimally elevated troponin was discharged from the ER and followed up at [facility]  No medication changes made PET scan performed, no ischemia   [facility]HC 09/26/13: No evidence of significant coronary disease. [place] 09/26/13: RA 25 RV 60/25 PA 60/37 (18) W 55 Fick CO/CI 5.23/2.56  [employer] ICD for primary prevention placed 03/12/14  Imaging:  TTE 01/29/2020 LVEF of 30-35%  TTE 12/19/2018 LVEF of 30%. Mild biatrial enlargement.  Degenerative mitral valve disease  TTE 09/2016 EF 78% grade 1 diastolic dysfunction  TTE 07/16/14 shows EF 30-35%, normal RV function, small collapsing IVC  TTE 01/18/14 shows EF of 30-35%.  TTE 12/14/13 shows EF of 35%, left ventricular enlargement, degenerative MV disease, dilated right and left atrium.  09/25/13 Cardiac MRI: 4 chamber cardiomegaly, EF calculated 14%, moderate MR, mild AI, mild TR, trace pericardial effusion. No evidence of infarct or inflammatory changes on delayed hyperenhancement images.  TTE 09/23/13: EF 15-20%, dilated LV (LVIDD 5.8cm), moderate MR, dilated LA, mild PHTN, mildly dilated RV, severe decreased RV function, mild TR, dilated RA, small pericardial effusion, PASP 40-19mm Hg, CVP 15-45mm Hg.  TTE 12/14/12: EF 35%.     Past Medical History:  Diagnosis Date  . Cancer ([place])   . Cardiac defibrillator in place   . CHF (congestive heart failure) ([place])   . COPD (chronic obstructive pulmonary disease) ([place])   . Hypertension   . OSA (obstructive sleep apnea)   . Pacemaker     Past Surgical History:  Procedure Laterality Date  . ABDOMINAL HYSTERECTOMY    . JOINT REPLACEMENT    . TONSILLECTOMY       Home Medications:  Prior to Admission medications   Medication Sig Start Date End Date Taking? Authorizing Provider  acetaminophen (TYLENOL) 650 MG CR tablet Take 1,300 mg by mouth 3 (three) times daily. 04/25/20  Yes [provider]  albuterol (VENTOLIN HFA) 108 (90 Base) MCG/ACT inhaler Inhale 2 puffs into the lungs every 6 (six) hours as needed for shortness of breath or wheezing. 11/26/19  Yes Lamptey, Myrene Galas, MD  allopurinol (ZYLOPRIM) 300 MG tablet Take  300 mg by mouth daily.    Yes [provider]  atorvastatin (LIPITOR) 40 MG tablet Take 40 mg by mouth daily.   Yes [provider]  cetirizine (ZYRTEC) 10 MG tablet Take 10 mg by mouth daily. 02/24/21  Yes [provider]  DULoxetine (CYMBALTA) 60 MG capsule Take 120  mg by mouth daily.    Yes [provider]  ELIQUIS 5 MG TABS tablet Take 1 tablet by mouth 2 (two) times daily. 03/20/21  Yes [provider]  furosemide (LASIX) 20 MG tablet Take 20 mg by mouth.    Yes [provider]  gabapentin (NEURONTIN) 400 MG capsule Take 800 mg by mouth 3 (three) times daily.    Yes [provider]  HYDROcodone-acetaminophen (NORCO/VICODIN) 5-325 MG tablet Take 1 tablet by mouth 2 (two) times daily as needed for pain. 06/22/19  Yes [provider]  magnesium oxide (MAG-OX) 400 MG tablet Take 400 mg by mouth daily.   Yes [provider]  metoprolol succinate (TOPROL-XL) 50 MG 24 hr tablet Take 50 mg by mouth daily.    Yes [provider]  Multiple Vitamins-Minerals (ONE-A-DAY WOMENS 50 PLUS) TABS Take 1 tablet by mouth daily.   Yes [provider]  naphazoline-glycerin (CLEAR EYES) 0.012-0.2 % SOLN 1-2 drops 4 (four) times daily as needed for eye irritation.   Yes [provider]  olopatadine (PATANOL) 0.1 % ophthalmic solution Place 1 drop into both eyes in the morning and at bedtime. 01/23/21 01/23/22 Yes [provider]  sacubitril-valsartan (ENTRESTO) 97-103 MG Take 1 tablet by mouth 2 (two) times daily. 12/11/18  Yes [provider]  [place] 2.5-2.5 MCG/ACT AERS Take 2 puffs by mouth daily. 02/24/21  Yes [provider]  tamoxifen (NOLVADEX) 20 MG tablet Take 20 mg by mouth daily.   Yes [provider]  tiotropium (SPIRIVA) 18 MCG inhalation capsule Place 18 mcg into inhaler and inhale daily.   Yes [provider]  diclofenac sodium (VOLTAREN) 1 % GEL Apply 2 g topically 4 (four) times daily.  Patient not taking: No sig reported    [provider]  Multiple Vitamin (MULTIVITAMIN) LIQD Take 5 mLs by mouth daily. Patient not taking: No sig reported    [provider]  predniSONE (STERAPRED UNI-PAK 21 TAB) 10 MG (21) TBPK tablet  Take by mouth daily. 60 mg x 1 day, 50 mg x 1 day, 40 mg x 1 day, 30 mg x 1 day, 20 mg x 1 day, 10 mg x 1 day Patient not taking: No sig reported 11/26/19   Chase Picket, MD  spironolactone (ALDACTONE) 25 MG tablet Take 25 mg by mouth daily. Patient not taking: No sig reported    [provider]    Inpatient Medications: Scheduled Meds: . [START ON 03/28/2021] allopurinol  300 mg Oral Daily  . [START ON 03/28/2021] atorvastatin  40 mg Oral Daily  . [START ON 03/28/2021] furosemide  20 mg Oral Daily  . [START ON 03/28/2021] gabapentin  800 mg Oral TID  . [START ON 03/28/2021] metoprolol succinate  50 mg Oral Daily  . sacubitril-valsartan  1 tablet Oral BID  . [START ON 03/28/2021] spironolactone  25 mg Oral Daily  . [START ON 03/28/2021] tamoxifen  20 mg Oral Daily  . [START ON 03/28/2021] tiotropium  18 mcg Inhalation Daily   Continuous Infusions: . amiodarone 60 mg/hr (03/27/21 1422)   Followed by  . amiodarone     PRN  Meds: acetaminophen **OR** acetaminophen, albuterol, HYDROcodone-acetaminophen  Allergies:    Allergies  Allergen Reactions  . Sulfa Antibiotics Rash and Shortness Of Breath  . Ambien [Zolpidem Tartrate]   . Lyrica [Pregabalin]   . Tetanus Toxoids     Social History:   Social History   Socioeconomic History  . Marital status: Widowed    Spouse name: Not on file  . Number of children: Not on file  . Years of education: Not on file  . Highest education level: Not on file  Occupational History  . Not on file  Tobacco Use  . Smoking status: Former Smoker    Quit date: 10/29/2009    Years since quitting: 11.4  . Smokeless tobacco: Never Used  Vaping Use  . Vaping Use: Never used  Substance and Sexual Activity  . Alcohol use: Not Currently  . Drug use: Never  . Sexual activity: Not Currently  Other Topics Concern  . Not on file  Social History Narrative  . Not on file   Social Determinants of Health   Financial Resource Strain: Not on  file  Food Insecurity: Not on file  Transportation Needs: Not on file  Physical Activity: Not on file  Stress: Not on file  Social Connections: Not on file  Intimate Partner Violence: Not on file    Family History:    Family History  Problem Relation Age of Onset  . Asthma Mother   . Heart disease Mother   . Kidney disease Mother   . Pancreatic cancer Father      ROS:  Please see the history of present illness.  Review of Systems  Constitutional: Negative.   HENT: Negative.   Respiratory: Negative.   Cardiovascular: Positive for chest pain.  Gastrointestinal: Negative.   Musculoskeletal: Negative.   Neurological: Negative.   Psychiatric/Behavioral: Negative.   All other systems reviewed and are negative.    Physical Exam/Data:   Vitals:   03/27/21 1100 03/27/21 1130 03/27/21 1300 03/27/21 1400  BP: 108/76 (!) 94/54  95/68  Pulse: 88 (!) 46  (!) 40  Resp: 19 18  18   Temp:      SpO2: 95% 96%  96%  Weight:   113.9 kg    No intake or output data in the 24 hours ending 03/27/21 1644 Last 3 Weights 03/27/2021 03/08/2021 11/26/2019  Weight (lbs) 251 lb 240 lb 226 lb  Weight (kg) 113.853 kg 108.863 kg 102.513 kg     Body mass index is 43.08 kg/m.  General:  Well nourished, well developed, in no acute distress morbidly obese HEENT: normal Lymph: no adenopathy Neck: no JVD Endocrine:  No thryomegaly Vascular: No carotid bruits; FA pulses 2+ bilaterally without bruits  Cardiac:  normal S1, S2; RRR; no murmur  Lungs:  clear to auscultation bilaterally, no wheezing, rhonchi or rales  Abd: soft, nontender, no hepatomegaly  Ext: no edema Musculoskeletal:  No deformities, BUE and BLE strength normal and equal Skin: warm and dry  Neuro:  CNs 2-12 intact, no focal abnormalities noted Psych:  Normal affect   EKG:  The EKG was personally reviewed and demonstrates:   Normal sinus rhythm rate 94 bpm, PVCs nonspecific ST and T wave abnormality V5, V6  Telemetry:  Telemetry  was personally reviewed and demonstrates:   Normal sinus rhythm  Relevant CV Studies:  Recent PET myocardial perfusion imaging study at [facility] Quality issues: None reported  Height: 161.3 cm (64 in)  Weight: 108.9 KG (239.6 LB)  BMI: 41.9  BSA: (2.21)  Resting BP: 127/61   HR: 88  Stress BP: 106/57  HR: 94  Max HR for  age: 48   % Max HR achieved: 64%  Resting EKG: normal sinus rhythm, incomplete LBBB, non-specific ST T  changes  Stress EKG: Upon completion of the test, patient converted into an  irregular atrial arrhythmia that self resolved. , frequent PVCs  Stress symptoms: typical regadenoson symptoms   Nuclear findings: Perfusion images demonstrate homogeneous tracer  distribution throughout the myocardium with no stress induced perfusion  abnormalities.   Ventricular findings: Left ventricular systolic function is severely  reduced. Post stress the ejection fraction is calculated at 18%.      Echocardiogram February 05, 2020 at [facility]  1. The left ventricle is normal in size with normal wall thickness.  2. The left ventricular systolic function is moderately to severely  decreased, LVEF is visually estimated at 30-35%.  3. The mitral valve leaflets are mildly thickened with normal leaflet  mobility.  4. There is mild mitral valve regurgitation.  5. The left atrium is mildly dilated in size.  6. The right ventricle is normal in size, with normal systolic function.    Laboratory Data:  High Sensitivity Troponin:   Recent Labs  Lab 03/08/21 1136 03/08/21 1307 03/27/21 0812 03/27/21 1010  TROPONINIHS 124* 269* 65* 610*     Chemistry Recent Labs  Lab 03/27/21 0812  NA 139  K 3.9  CL 106  CO2 20*  GLUCOSE 136*  BUN 30*  CREATININE 1.23*  CALCIUM 8.6*  GFRNONAA 47*  ANIONGAP 13    Recent Labs  Lab 03/27/21 0812  PROT 6.8  ALBUMIN 3.7  AST 33  ALT 21  ALKPHOS 55  BILITOT 1.1   Hematology Recent Labs  Lab 03/27/21 0812   WBC 13.5*  RBC 3.63*  HGB 12.4  HCT 38.4  MCV 105.8*  MCH 34.2*  MCHC 32.3  RDW 14.6  PLT 235   BNPNo results for input(s): BNP, PROBNP in the last 168 hours.  DDimer No results for input(s): DDIMER in the last 168 hours.   Radiology/Studies:  DG Chest Portable 1 View  Result Date: 03/27/2021 CLINICAL DATA:  70 year old female with chest pain. EXAM: PORTABLE CHEST 1 VIEW COMPARISON:  Chest radiographs 10/30/2019 and earlier. FINDINGS: Portable AP semi upright view at 0820 hours. Cardiac size is stable and at the upper limits of normal. Stable left chest AICD. Other mediastinal contours are within normal limits. Visualized tracheal air column is within normal limits. Stable lung volumes. Allowing for portable technique the lungs are clear. No pneumothorax or pleural effusion. No acute osseous abnormality identified. IMPRESSION: No acute cardiopulmonary abnormality. Electronically Signed   By: Genevie Ann M.D.   On: 03/27/2021 09:26     Assessment and Plan:   1. ICD shock X4 ICD download concerning for recurrent atrial fibrillation Also likely responsible for ICD shock dating back April 2022 -Details discussed with [facility] Scientific rep at the bedside at the time of download -No clear evidence of VT noted though QRS morphology on fourth shock slightly different though atrial activity consistent with atrial fibrillation ----Interestingly had tachyarrhythmia during her stress test, concerning for atrial fibrillation given above ----She is very anxious about having additional shocks, Will start amiodarone bolus with infusion, continue beta-blocker --- We will need to start Eliquis 5 twice daily  2.  Cardiomyopathy, dilated Nonischemic cardiomyopathy, cardiac catheterization 2014 no coronary disease, recent PET CT Myoview at [facility] with no ischemia -Ejection  fraction post stress at [facility] estimated 18% -Repeat echocardiogram ordered, last in March 2021 ejection fraction 30 to 35% at that  time  3.  Anxiety Recent ICD shocks, it would appear [facility] cardiology has referred her to psychiatry -Management of arrhythmia as above  4.  Morbid obesity Would likely benefit from meeting with a nutritionist, lifestyle modification recommended  5.  Chronic shortness of breath/COPD Deconditioned, morbid obesity, cardiomyopathy also contributing Repeat echocardiogram as above, evaluate for pulmonary hypertension No recent right heart catheterization results available   Total encounter time more than 110 minutes  Greater than 50% was spent in counseling and coordination of care with the patient    For questions or updates, please contact [place] Please consult [email or web address] for contact info under    Signed, Ida Rogue, MD  03/27/2021 4:44 PM

## 2021-03-27 NOTE — ED Notes (Signed)
Pacemaker interrogator representative ETA 30 minutes

## 2021-03-27 NOTE — ED Notes (Signed)
[facility] for consult [phone]

## 2021-03-27 NOTE — ED Notes (Signed)
Critical lactic level - MD Quentin Cornwall made aware. Attempted to interrogate defibrillator however unsuccessful as the device is no longer supported by [employer] wifi towers per the [place] help line. The rep was called, awaiting further assistance.

## 2021-03-28 ENCOUNTER — Other Ambulatory Visit: Payer: Self-pay

## 2021-03-28 ENCOUNTER — Encounter: Payer: Self-pay | Admitting: Internal Medicine

## 2021-03-28 DIAGNOSIS — I428 Other cardiomyopathies: Secondary | ICD-10-CM

## 2021-03-28 DIAGNOSIS — I5042 Chronic combined systolic (congestive) and diastolic (congestive) heart failure: Secondary | ICD-10-CM | POA: Diagnosis not present

## 2021-03-28 DIAGNOSIS — Z4502 Encounter for adjustment and management of automatic implantable cardiac defibrillator: Secondary | ICD-10-CM | POA: Diagnosis not present

## 2021-03-28 DIAGNOSIS — I480 Paroxysmal atrial fibrillation: Secondary | ICD-10-CM | POA: Diagnosis not present

## 2021-03-28 LAB — BASIC METABOLIC PANEL
Anion gap: 11 (ref 5–15)
BUN: 40 mg/dL — ABNORMAL HIGH (ref 8–23)
CO2: 24 mmol/L (ref 22–32)
Calcium: 9 mg/dL (ref 8.9–10.3)
Chloride: 104 mmol/L (ref 98–111)
Creatinine, Ser: 1.54 mg/dL — ABNORMAL HIGH (ref 0.44–1.00)
GFR, Estimated: 36 mL/min — ABNORMAL LOW (ref >60)
Glucose, Bld: 124 mg/dL — ABNORMAL HIGH (ref 70–99)
Potassium: 4.1 mmol/L (ref 3.5–5.1)
Sodium: 139 mmol/L (ref 135–145)

## 2021-03-28 LAB — CBC
HCT: 40 % (ref 36.0–46.0)
Hemoglobin: 12.6 g/dL (ref 12.0–15.0)
MCH: 34.1 pg — ABNORMAL HIGH (ref 26.0–34.0)
MCHC: 31.5 g/dL (ref 30.0–36.0)
MCV: 108.1 fL — ABNORMAL HIGH (ref 80.0–100.0)
Platelets: 212 10*3/uL (ref 150–400)
RBC: 3.7 MIL/uL — ABNORMAL LOW (ref 3.87–5.11)
RDW: 14.7 % (ref 11.5–15.5)
WBC: 12.2 10*3/uL — ABNORMAL HIGH (ref 4.0–10.5)
nRBC: 0.3 % — ABNORMAL HIGH (ref 0.0–0.2)

## 2021-03-28 LAB — TROPONIN I (HIGH SENSITIVITY): Troponin I (High Sensitivity): 260 ng/L (ref <18)

## 2021-03-28 LAB — MAGNESIUM: Magnesium: 2.4 mg/dL (ref 1.7–2.4)

## 2021-03-28 MED ORDER — FUROSEMIDE 20 MG PO TABS
20.0000 mg | ORAL_TABLET | Freq: Every day | ORAL | Status: DC
  Administered 2021-03-28: 20 mg via ORAL
  Filled 2021-03-28: qty 1

## 2021-03-28 MED ORDER — FUROSEMIDE 20 MG PO TABS
20.0000 mg | ORAL_TABLET | Freq: Every day | ORAL | Status: DC
  Administered 2021-03-29: 20 mg via ORAL
  Filled 2021-03-28: qty 1

## 2021-03-28 MED ORDER — LACTATED RINGERS IV SOLN: INTRAVENOUS | Status: DC

## 2021-03-28 MED ORDER — SACUBITRIL-VALSARTAN 97-103 MG PO TABS
1.0000 | ORAL_TABLET | Freq: Two times a day (BID) | ORAL | Status: DC
  Administered 2021-03-28 – 2021-03-29 (×2): 1 via ORAL
  Filled 2021-03-28 (×3): qty 1

## 2021-03-28 NOTE — Progress Notes (Signed)
Progress Note  Patient Name: Sophia Andrews Date of Encounter: 03/28/2021  [facility] Cardiologist: [facility] cardiology  Subjective   Feels okay, worried about getting shocked again.  Denies palpitations, chest pain.  Inpatient Medications    Scheduled Meds: . allopurinol  300 mg Oral Daily  . apixaban  5 mg Oral BID  . atorvastatin  40 mg Oral Daily  . gabapentin  800 mg Oral TID  . metoprolol succinate  50 mg Oral Daily  . tamoxifen  20 mg Oral Daily  . tiotropium  18 mcg Inhalation Daily   Continuous Infusions: . amiodarone 30 mg/hr (03/28/21 0221)  . lactated ringers 50 mL/hr at 03/28/21 1101   PRN Meds: acetaminophen **OR** acetaminophen, albuterol, HYDROcodone-acetaminophen   Vital Signs    Vitals:   03/28/21 0227 03/28/21 0629 03/28/21 0820 03/28/21 1156  BP: 116/77 118/73 127/62 (!) 159/118  Pulse: 77 74 91 (!) 42  Resp: 16 18 18 19   Temp: 98.1 F (36.7 C) 97.9 F (36.6 C) 97.7 F (36.5 C) 97.7 F (36.5 C)  TempSrc: Oral Oral    SpO2: 95% 93% 94% 93%  Weight: 110.6 kg  109.7 kg     Intake/Output Summary (Last 24 hours) at 03/28/2021 1502 Last data filed at 03/28/2021 1401 Gross per 24 hour  Intake 755.22 ml  Output 0 ml  Net 755.22 ml   Last 3 Weights 03/28/2021 03/28/2021 03/27/2021  Weight (lbs) 241 lb 12.8 oz 243 lb 12.8 oz 251 lb  Weight (kg) 109.68 kg 110.587 kg 113.853 kg      Telemetry    Sinus rhythm, occasional PVCs- Personally Reviewed  ECG    no new tracing obtained- Personally Reviewed  Physical Exam   GEN: No acute distress.   Neck: No JVD Cardiac: RRR, no murmurs, rubs, or gallops.  Respiratory: Clear to auscultation bilaterally. GI: Soft, nontender, non-distended  MS: No edema; No deformity. Neuro:  Nonfocal  Psych: Normal affect   Labs    High Sensitivity Troponin:   Recent Labs  Lab 03/08/21 1136 03/08/21 1307 03/27/21 0812 03/27/21 1010 03/28/21 0535  TROPONINIHS 124* 269* 65* 610* 260*       Chemistry Recent Labs  Lab 03/27/21 0812 03/28/21 0535  NA 139 139  K 3.9 4.1  CL 106 104  CO2 20* 24  GLUCOSE 136* 124*  BUN 30* 40*  CREATININE 1.23* 1.54*  CALCIUM 8.6* 9.0  PROT 6.8  --   ALBUMIN 3.7  --   AST 33  --   ALT 21  --   ALKPHOS 55  --   BILITOT 1.1  --   GFRNONAA 47* 36*  ANIONGAP 13 11     Hematology Recent Labs  Lab 03/27/21 0812 03/28/21 0535  WBC 13.5* 12.2*  RBC 3.63* 3.70*  HGB 12.4 12.6  HCT 38.4 40.0  MCV 105.8* 108.1*  MCH 34.2* 34.1*  MCHC 32.3 31.5  RDW 14.6 14.7  PLT 235 212    BNPNo results for input(s): BNP, PROBNP in the last 168 hours.   DDimer No results for input(s): DDIMER in the last 168 hours.   Radiology    DG Chest Portable 1 View  Result Date: 03/27/2021 CLINICAL DATA:  70 year old female with chest pain. EXAM: PORTABLE CHEST 1 VIEW COMPARISON:  Chest radiographs 10/30/2019 and earlier. FINDINGS: Portable AP semi upright view at 0820 hours. Cardiac size is stable and at the upper limits of normal. Stable left chest AICD. Other mediastinal contours are within normal  limits. Visualized tracheal air column is within normal limits. Stable lung volumes. Allowing for portable technique the lungs are clear. No pneumothorax or pleural effusion. No acute osseous abnormality identified. IMPRESSION: No acute cardiopulmonary abnormality. Electronically Signed   By: Genevie Ann M.D.   On: 03/27/2021 09:26    Cardiac Studies   Echo 07/2019 1. Left ventricular ejection fraction, by visual estimation, is 30 to  35%. The left ventricle has normal function. Mildly increased left  ventricular size. There is no left ventricular hypertrophy.Global  hypokinesis, unable to exclude regional wall  motion abnormality.  2. Left ventricular diastolic Doppler parameters are consistent with  impaired relaxation pattern of LV diastolic filling.  3. Global right ventricle has normal systolic function.The right  ventricular size is normal. No  increase in right ventricular wall  thickness.  4. Left atrial size was mildly dilated.  5. Mild mitral valve regurgitation.  6. A pacer wire is visualized.   Patient Profile     70 y.o. female with history of COPD, nonischemic cardiomyopathy, AICD implant 2015, OSA presenting due to ICD shock x4.  Assessment & Plan    1.  ICD shock x4 -A. fib with RVR noted as cause for cardioversion upon interrogation -On IV amiodarone, no further episodes of A. fib noted -Continue IV amiodarone to complete 24-hour course, start p.o. amiodarone 400 twice daily in the a.m. -Replete lites to keep potassium over 4, magnesium over 2  2.  Paroxysmal A. Fib -Currently in sinus -Amiodarone, Toprol-XL, Eliquis  3.  Nonischemic cardiomyopathy, EF 35% -Toprol-XL, Lasix 20 mg daily. -Restart Entresto.  If BP okay, restart Aldactone.  Total encounter time 35 minutes  Greater than 50% was spent in counseling and coordination of care with the patient    Signed, Kate Sable, MD  03/28/2021, 3:02 PM

## 2021-03-28 NOTE — Progress Notes (Addendum)
Progress Note    Sophia Andrews  KWI:[id number] DOB: 1951-01-16  DOA: 03/27/2021 PCP: Christen Bame, DO      Brief Narrative:    Medical records reviewed and are as summarized below:  Sophia Andrews is a 70 y.o. female with medical history significant for chronic systolic and diastolic CHF (EF 30 to 24%), s/p ICD, hypertension, COPD, morbid obesity.  She presented to the hospital because her ICD fired 4 times.  ICD interrogation revealed atrial fibrillation with RVR.      Assessment/Plan:   Principal Problem:   AF (paroxysmal atrial fibrillation) (HCC) Active Problems:   Chronic combined systolic (congestive) and diastolic (congestive) heart failure (HCC)   AKI (acute kidney injury) (HCC)   COPD (chronic obstructive pulmonary disease) (HCC)   HTN (hypertension)   Defibrillator discharge   Atypical chest pain   Elevated troponin   Leukocytosis   NICM (nonischemic cardiomyopathy) ([place])    Body mass index is 41.5 kg/m.  (Morbid obesity)  Atrial fibrillation with RVR, ICD shocks x4: ICD interrogation showed atrial fibrillation with RVR.  Continue amiodarone infusion.  Continue Eliquis.  Follow-up with cardiologist.  Chronic systolic and diastolic CHF, s/p ICD: 2D echo in September 2020 showed EF estimated at 30 to 35%.  Continue Lasix, metoprolol and Entresto.  CKD stage IIIa: It appears patient has CKD stage IIIa and not AKI based on chart review.  Discontinue IV fluids and monitor BMP closely.  Elevated troponins: This is from demand ischemia/ICD shocks/rapid A. fib.  Hypertension: Continue metoprolol.  COPD: Stable.  OSA: Ordered CPAP for use at night  Plan of care was discussed with the cardiologist.  Diet Order            Diet Heart Room service appropriate? Yes; Fluid consistency: Thin  Diet effective now                    Consultants:  Cardiologist  Procedures:  None    Medications:   . allopurinol  300 mg Oral Daily   . apixaban  5 mg Oral BID  . atorvastatin  40 mg Oral Daily  . gabapentin  800 mg Oral TID  . metoprolol succinate  50 mg Oral Daily  . sacubitril-valsartan  1 tablet Oral BID  . tamoxifen  20 mg Oral Daily  . tiotropium  18 mcg Inhalation Daily   Continuous Infusions: . amiodarone 30 mg/hr (03/28/21 0221)  . lactated ringers 50 mL/hr at 03/28/21 1101     Anti-infectives (From admission, onward)   None             Family Communication/Anticipated D/C date and plan/Code Status   DVT prophylaxis: SCDs Start: 03/27/21 1245 apixaban (ELIQUIS) tablet 5 mg     Code Status: Full Code  Family Communication: None Disposition Plan:    Status is: Inpatient  Remains inpatient appropriate because:IV treatments appropriate due to intensity of illness or inability to take PO   Dispo: The patient is from: Home              Anticipated d/c is to: Home              Patient currently is not medically stable to d/c.   Difficult to place patient No           Subjective:   C/o feeling anxious about everything that is going on.  No chest pain or shortness of breath.  Objective:    Vitals:  03/28/21 0629 03/28/21 0820 03/28/21 1156 03/28/21 1554  BP: 118/73 127/62 (!) 159/118 (!) 108/59  Pulse: 74 91 (!) 42 76  Resp: 18 18 19 18   Temp: 97.9 F (36.6 C) 97.7 F (36.5 C) 97.7 F (36.5 C) 97.9 F (36.6 C)  TempSrc: Oral     SpO2: 93% 94% 93% 98%  Weight:  109.7 kg     No data found.   Intake/Output Summary (Last 24 hours) at 03/28/2021 1712 Last data filed at 03/28/2021 1554 Gross per 24 hour  Intake 755.22 ml  Output 0 ml  Net 755.22 ml   Filed Weights   03/27/21 1300 03/28/21 0227 03/28/21 0820  Weight: 113.9 kg 110.6 kg 109.7 kg    Exam:  GEN: NAD SKIN: Warm and dry EYES: No pallor or icterus ENT: MMM CV: Irregular rate and rhythm PULM: CTA B ABD: soft, obese, NT, +BS CNS: AAO x 3, non focal EXT: No edema or tenderness       Data  Reviewed:   I have personally reviewed following labs and imaging studies:  Labs: Labs show the following:   Basic Metabolic Panel: Recent Labs  Lab 03/27/21 0812 03/28/21 0535  NA 139 139  K 3.9 4.1  CL 106 104  CO2 20* 24  GLUCOSE 136* 124*  BUN 30* 40*  CREATININE 1.23* 1.54*  CALCIUM 8.6* 9.0  MG 2.0 2.4   GFR Estimated Creatinine Clearance: 40 mL/min (A) (by C-G formula based on SCr of 1.54 mg/dL (H)). Liver Function Tests: Recent Labs  Lab 03/27/21 0812  AST 33  ALT 21  ALKPHOS 55  BILITOT 1.1  PROT 6.8  ALBUMIN 3.7   No results for input(s): LIPASE, AMYLASE in the last 168 hours. No results for input(s): AMMONIA in the last 168 hours. Coagulation profile No results for input(s): INR, PROTIME in the last 168 hours.  CBC: Recent Labs  Lab 03/27/21 0812 03/28/21 0535  WBC 13.5* 12.2*  HGB 12.4 12.6  HCT 38.4 40.0  MCV 105.8* 108.1*  PLT 235 212   Cardiac Enzymes: No results for input(s): CKTOTAL, CKMB, CKMBINDEX, TROPONINI in the last 168 hours. BNP (last 3 results) No results for input(s): PROBNP in the last 8760 hours. CBG: No results for input(s): GLUCAP in the last 168 hours. D-Dimer: No results for input(s): DDIMER in the last 72 hours. Hgb A1c: No results for input(s): HGBA1C in the last 72 hours. Lipid Profile: No results for input(s): CHOL, HDL, LDLCALC, TRIG, CHOLHDL, LDLDIRECT in the last 72 hours. Thyroid function studies: No results for input(s): TSH, T4TOTAL, T3FREE, THYROIDAB in the last 72 hours.  Invalid input(s): FREET3 Anemia work up: No results for input(s): VITAMINB12, FOLATE, FERRITIN, TIBC, IRON, RETICCTPCT in the last 72 hours. Sepsis Labs: Recent Labs  Lab 03/27/21 0812 03/28/21 0535  WBC 13.5* 12.2*    Microbiology Recent Results (from the past 240 hour(s))  Resp Panel by RT-PCR (Flu A&B, Covid) Nasopharyngeal Swab     Status: None   Collection Time: 03/27/21 10:10 AM   Specimen: Nasopharyngeal Swab;  Nasopharyngeal(NP) swabs in vial transport medium  Result Value Ref Range Status   SARS Coronavirus 2 by RT PCR NEGATIVE NEGATIVE Final    Comment: (NOTE) SARS-CoV-2 target nucleic acids are NOT DETECTED.  The SARS-CoV-2 RNA is generally detectable in upper respiratory specimens during the acute phase of infection. The lowest concentration of SARS-CoV-2 viral copies this assay can detect is 138 copies/mL. A negative result does not preclude SARS-Cov-2 infection  and should not be used as the sole basis for treatment or other patient management decisions. A negative result may occur with  improper specimen collection/handling, submission of specimen other than nasopharyngeal swab, presence of viral mutation(s) within the areas targeted by this assay, and inadequate number of viral copies(<138 copies/mL). A negative result must be combined with clinical observations, patient history, and epidemiological information. The expected result is Negative.  Fact Sheet for Patients:  EntrepreneurPulse.com.au  Fact Sheet for Healthcare Providers:  IncredibleEmployment.be  This test is no t yet approved or cleared by the [place] FDA and  has been authorized for detection and/or diagnosis of SARS-CoV-2 by FDA under an Emergency Use Authorization (EUA). This EUA will remain  in effect (meaning this test can be used) for the duration of the COVID-19 declaration under Section 564(b)(1) of the Act, 21 U.S.C.section 360bbb-3(b)(1), unless the authorization is terminated  or revoked sooner.       Influenza A by PCR NEGATIVE NEGATIVE Final   Influenza B by PCR NEGATIVE NEGATIVE Final    Comment: (NOTE) The Xpert Xpress SARS-CoV-2/FLU/RSV plus assay is intended as an aid in the diagnosis of influenza from Nasopharyngeal swab specimens and should not be used as a sole basis for treatment. Nasal washings and aspirates are unacceptable for Xpert Xpress  SARS-CoV-2/FLU/RSV testing.  Fact Sheet for Patients: EntrepreneurPulse.com.au  Fact Sheet for Healthcare Providers: IncredibleEmployment.be  This test is not yet approved or cleared by the [place] FDA and has been authorized for detection and/or diagnosis of SARS-CoV-2 by FDA under an Emergency Use Authorization (EUA). This EUA will remain in effect (meaning this test can be used) for the duration of the COVID-19 declaration under Section 564(b)(1) of the Act, 21 U.S.C. section 360bbb-3(b)(1), unless the authorization is terminated or revoked.  Performed at [facility], [address], [place], [place] [address]     Procedures and diagnostic studies:  DG Chest Portable 1 View  Result Date: 03/27/2021 CLINICAL DATA:  70 year old female with chest pain. EXAM: PORTABLE CHEST 1 VIEW COMPARISON:  Chest radiographs 10/30/2019 and earlier. FINDINGS: Portable AP semi upright view at 0820 hours. Cardiac size is stable and at the upper limits of normal. Stable left chest AICD. Other mediastinal contours are within normal limits. Visualized tracheal air column is within normal limits. Stable lung volumes. Allowing for portable technique the lungs are clear. No pneumothorax or pleural effusion. No acute osseous abnormality identified. IMPRESSION: No acute cardiopulmonary abnormality. Electronically Signed   By: Genevie Ann M.D.   On: 03/27/2021 09:26               LOS: 1 day   Kenlee Maler  [facility]   Pager on [email or web address]. If 7PM-7AM, please contact night-coverage at [email or web address]     03/28/2021, 5:12 PM

## 2021-03-28 NOTE — Plan of Care (Signed)

## 2021-03-29 DIAGNOSIS — I5042 Chronic combined systolic (congestive) and diastolic (congestive) heart failure: Secondary | ICD-10-CM | POA: Diagnosis not present

## 2021-03-29 DIAGNOSIS — Z4502 Encounter for adjustment and management of automatic implantable cardiac defibrillator: Secondary | ICD-10-CM | POA: Diagnosis not present

## 2021-03-29 DIAGNOSIS — I480 Paroxysmal atrial fibrillation: Secondary | ICD-10-CM | POA: Diagnosis not present

## 2021-03-29 DIAGNOSIS — R778 Other specified abnormalities of plasma proteins: Secondary | ICD-10-CM | POA: Diagnosis not present

## 2021-03-29 LAB — MAGNESIUM: Magnesium: 2.3 mg/dL (ref 1.7–2.4)

## 2021-03-29 LAB — BASIC METABOLIC PANEL
Anion gap: 9 (ref 5–15)
BUN: 39 mg/dL — ABNORMAL HIGH (ref 8–23)
CO2: 26 mmol/L (ref 22–32)
Calcium: 9.3 mg/dL (ref 8.9–10.3)
Chloride: 107 mmol/L (ref 98–111)
Creatinine, Ser: 1.43 mg/dL — ABNORMAL HIGH (ref 0.44–1.00)
GFR, Estimated: 39 mL/min — ABNORMAL LOW (ref >60)
Glucose, Bld: 137 mg/dL — ABNORMAL HIGH (ref 70–99)
Potassium: 4.2 mmol/L (ref 3.5–5.1)
Sodium: 142 mmol/L (ref 135–145)

## 2021-03-29 LAB — CBC WITH DIFFERENTIAL/PLATELET
Abs Immature Granulocytes: 0.08 K/uL — ABNORMAL HIGH (ref 0.00–0.07)
Basophils Absolute: 0 K/uL (ref 0.0–0.1)
Basophils Relative: 0 %
Eosinophils Absolute: 0 K/uL (ref 0.0–0.5)
Eosinophils Relative: 0 %
HCT: 39.9 % (ref 36.0–46.0)
Hemoglobin: 12.4 g/dL (ref 12.0–15.0)
Immature Granulocytes: 1 %
Lymphocytes Relative: 26 %
Lymphs Abs: 3.2 K/uL (ref 0.7–4.0)
MCH: 33.5 pg (ref 26.0–34.0)
MCHC: 31.1 g/dL (ref 30.0–36.0)
MCV: 107.8 fL — ABNORMAL HIGH (ref 80.0–100.0)
Monocytes Absolute: 1.2 K/uL — ABNORMAL HIGH (ref 0.1–1.0)
Monocytes Relative: 10 %
Neutro Abs: 7.5 K/uL (ref 1.7–7.7)
Neutrophils Relative %: 63 %
Platelets: 226 K/uL (ref 150–400)
RBC: 3.7 MIL/uL — ABNORMAL LOW (ref 3.87–5.11)
RDW: 14.6 % (ref 11.5–15.5)
WBC: 12 K/uL — ABNORMAL HIGH (ref 4.0–10.5)
nRBC: 0.3 % — ABNORMAL HIGH (ref 0.0–0.2)

## 2021-03-29 MED ORDER — POLYETHYLENE GLYCOL 3350 17 G PO PACK
17.0000 g | PACK | Freq: Once | ORAL | Status: AC
  Administered 2021-03-29: 17 g via ORAL
  Filled 2021-03-29: qty 1

## 2021-03-29 MED ORDER — AMIODARONE HCL 400 MG PO TABS: 400.0000 mg | ORAL_TABLET | Freq: Two times a day (BID) | ORAL | 0 refills | Status: DC

## 2021-03-29 MED ORDER — AMIODARONE HCL 200 MG PO TABS: 200.0000 mg | ORAL_TABLET | Freq: Two times a day (BID) | ORAL | 0 refills | Status: AC

## 2021-03-29 MED ORDER — AMIODARONE HCL 200 MG PO TABS: 200.0000 mg | ORAL_TABLET | Freq: Two times a day (BID) | ORAL | Status: DC

## 2021-03-29 MED ORDER — AMIODARONE HCL 200 MG PO TABS
400.0000 mg | ORAL_TABLET | Freq: Two times a day (BID) | ORAL | Status: DC
  Administered 2021-03-29: 400 mg via ORAL
  Filled 2021-03-29: qty 2

## 2021-03-29 MED ORDER — SPIRONOLACTONE 25 MG PO TABS
25.0000 mg | ORAL_TABLET | Freq: Every day | ORAL | Status: DC
  Administered 2021-03-29: 25 mg via ORAL
  Filled 2021-03-29: qty 1

## 2021-03-29 NOTE — Discharge Summary (Signed)
Physician Discharge Summary  SHIRL FINNICUM [id number] DOB: May 18, 1951 DOA: 03/27/2021  PCP: Christen Bame, DO  Admit date: 03/27/2021 Discharge date: 03/29/2021  Discharge disposition: Home   Recommendations for Outpatient Follow-Up:   Follow-up with primary cardiologist as scheduled on Tuesday, 03/31/2021. Follow-up with PCP in 1 week  Discharge Diagnosis:   Principal Problem:   AF (paroxysmal atrial fibrillation) (HCC) Active Problems:   Chronic combined systolic (congestive) and diastolic (congestive) heart failure (HCC)   AKI (acute kidney injury) (HCC)   COPD (chronic obstructive pulmonary disease) (HCC)   HTN (hypertension)   Defibrillator discharge   Atypical chest pain   Elevated troponin   Leukocytosis   NICM (nonischemic cardiomyopathy) ([place])    Discharge Condition: Stable.  Diet recommendation:  Diet Order            Diet - low sodium heart healthy           Diet Heart Room service appropriate? Yes; Fluid consistency: Thin  Diet effective now                   Code Status: Full Code     Hospital Course:   Ms. Sophia Andrews is a 70 y.o. female with medical history significant for chronic systolic and diastolic CHF (EF 30 to [id number]), s/p ICD, hypertension, COPD, morbid obesity, OSA.  She presented to the hospital because her ICD fired 4 times.  This was associated with extreme anxiety. ICD interrogation revealed atrial fibrillation with RVR.  She was treated with IV amiodarone infusion.  She converted to normal sinus rhythm.  Cardiologist was consulted to assist with management.  She feels better and she remained in normal sinus rhythm.  From cardiology standpoint, patient is okay for discharge.  She will be discharged on oral amiodarone.     Medical Consultants:    Cardiologist [facility])   Discharge Exam:    Vitals:   03/29/21 0736 03/29/21 0918 03/29/21 1001 03/29/21 1130  BP: (!) 164/150  (!) 130/107 (!) 123/93  Pulse: 79   83 87  Resp: 16  18 18   Temp: 97.9 F (36.6 C)   97.9 F (36.6 C)  TempSrc: Oral   Oral  SpO2: 95%  90% 95%  Weight:  110.6 kg       GEN: NAD SKIN: Warm and dry EYES: No pallor or icterus ENT: MMM CV: RRR PULM: CTA B ABD: soft, obese, NT, +BS CNS: AAO x 3, non focal EXT: No edema or tenderness. ? Bilateral hammertoes.  2+ pulses in the dorsalis pedis arteries bilaterally   The results of significant diagnostics from this hospitalization (including imaging, microbiology, ancillary and laboratory) are listed below for reference.     Procedures and Diagnostic Studies:   DG Chest Portable 1 View  Result Date: 03/27/2021 CLINICAL DATA:  70 year old female with chest pain. EXAM: PORTABLE CHEST 1 VIEW COMPARISON:  Chest radiographs 10/30/2019 and earlier. FINDINGS: Portable AP semi upright view at 0820 hours. Cardiac size is stable and at the upper limits of normal. Stable left chest AICD. Other mediastinal contours are within normal limits. Visualized tracheal air column is within normal limits. Stable lung volumes. Allowing for portable technique the lungs are clear. No pneumothorax or pleural effusion. No acute osseous abnormality identified. IMPRESSION: No acute cardiopulmonary abnormality. Electronically Signed   By: Genevie Ann M.D.   On: 03/27/2021 09:26     Labs:   Basic Metabolic Panel: Recent Labs  Lab 03/27/21 908 160 9453 03/28/21 0535  03/29/21 0407  NA 139 139 142  K 3.9 4.1 4.2  CL 106 104 107  CO2 20* 24 26  GLUCOSE 136* 124* 137*  BUN 30* 40* 39*  CREATININE 1.23* 1.54* 1.43*  CALCIUM 8.6* 9.0 9.3  MG 2.0 2.4 2.3   GFR Estimated Creatinine Clearance: 43.3 mL/min (A) (by C-G formula based on SCr of 1.43 mg/dL (H)). Liver Function Tests: Recent Labs  Lab 03/27/21 0812  AST 33  ALT 21  ALKPHOS 55  BILITOT 1.1  PROT 6.8  ALBUMIN 3.7   No results for input(s): LIPASE, AMYLASE in the last 168 hours. No results for input(s): AMMONIA in the last 168  hours. Coagulation profile No results for input(s): INR, PROTIME in the last 168 hours.  CBC: Recent Labs  Lab 03/27/21 0812 03/28/21 0535 03/29/21 0407  WBC 13.5* 12.2* 12.0*  NEUTROABS  --   --  7.5  HGB 12.4 12.6 12.4  HCT 38.4 40.0 39.9  MCV 105.8* 108.1* 107.8*  PLT 235 212 226   Cardiac Enzymes: No results for input(s): CKTOTAL, CKMB, CKMBINDEX, TROPONINI in the last 168 hours. BNP: Invalid input(s): POCBNP CBG: No results for input(s): GLUCAP in the last 168 hours. D-Dimer No results for input(s): DDIMER in the last 72 hours. Hgb A1c No results for input(s): HGBA1C in the last 72 hours. Lipid Profile No results for input(s): CHOL, HDL, LDLCALC, TRIG, CHOLHDL, LDLDIRECT in the last 72 hours. Thyroid function studies No results for input(s): TSH, T4TOTAL, T3FREE, THYROIDAB in the last 72 hours.  Invalid input(s): FREET3 Anemia work up No results for input(s): VITAMINB12, FOLATE, FERRITIN, TIBC, IRON, RETICCTPCT in the last 72 hours. Microbiology Recent Results (from the past 240 hour(s))  Resp Panel by RT-PCR (Flu A&B, Covid) Nasopharyngeal Swab     Status: None   Collection Time: 03/27/21 10:10 AM   Specimen: Nasopharyngeal Swab; Nasopharyngeal(NP) swabs in vial transport medium  Result Value Ref Range Status   SARS Coronavirus 2 by RT PCR NEGATIVE NEGATIVE Final    Comment: (NOTE) SARS-CoV-2 target nucleic acids are NOT DETECTED.  The SARS-CoV-2 RNA is generally detectable in upper respiratory specimens during the acute phase of infection. The lowest concentration of SARS-CoV-2 viral copies this assay can detect is 138 copies/mL. A negative result does not preclude SARS-Cov-2 infection and should not be used as the sole basis for treatment or other patient management decisions. A negative result may occur with  improper specimen collection/handling, submission of specimen other than nasopharyngeal swab, presence of viral mutation(s) within the areas  targeted by this assay, and inadequate number of viral copies(<138 copies/mL). A negative result must be combined with clinical observations, patient history, and epidemiological information. The expected result is Negative.  Fact Sheet for Patients:  EntrepreneurPulse.com.au  Fact Sheet for Healthcare Providers:  IncredibleEmployment.be  This test is no t yet approved or cleared by the [place] FDA and  has been authorized for detection and/or diagnosis of SARS-CoV-2 by FDA under an Emergency Use Authorization (EUA). This EUA will remain  in effect (meaning this test can be used) for the duration of the COVID-19 declaration under Section 564(b)(1) of the Act, 21 U.S.C.section 360bbb-3(b)(1), unless the authorization is terminated  or revoked sooner.       Influenza A by PCR NEGATIVE NEGATIVE Final   Influenza B by PCR NEGATIVE NEGATIVE Final    Comment: (NOTE) The Xpert Xpress SARS-CoV-2/FLU/RSV plus assay is intended as an aid in the diagnosis of influenza from Nasopharyngeal swab  specimens and should not be used as a sole basis for treatment. Nasal washings and aspirates are unacceptable for Xpert Xpress SARS-CoV-2/FLU/RSV testing.  Fact Sheet for Patients: EntrepreneurPulse.com.au  Fact Sheet for Healthcare Providers: IncredibleEmployment.be  This test is not yet approved or cleared by the [place] FDA and has been authorized for detection and/or diagnosis of SARS-CoV-2 by FDA under an Emergency Use Authorization (EUA). This EUA will remain in effect (meaning this test can be used) for the duration of the COVID-19 declaration under Section 564(b)(1) of the Act, 21 U.S.C. section 360bbb-3(b)(1), unless the authorization is terminated or revoked.  Performed at [facility], [address] [place], [place] [address]      Discharge Instructions:   Discharge Instructions     Diet - low sodium heart healthy   Complete by: As directed    Discharge instructions   Complete by: As directed    Follow-up with your cardiologist as scheduled on Tuesday, 03/31/2021   Increase activity slowly   Complete by: As directed      Allergies as of 03/29/2021      Reactions   Sulfa Antibiotics Rash, Shortness Of Breath   Ambien [zolpidem Tartrate]    Lyrica [pregabalin]    Tetanus Toxoids       Medication List    STOP taking these medications   diclofenac sodium 1 % Gel Commonly known as: VOLTAREN   multivitamin Liqd   predniSONE 10 MG (21) Tbpk tablet Commonly known as: STERAPRED UNI-PAK 21 TAB   tiotropium 18 MCG inhalation capsule Commonly known as: SPIRIVA     TAKE these medications   acetaminophen 650 MG CR tablet Commonly known as: TYLENOL Take 1,300 mg by mouth 3 (three) times daily.   albuterol 108 (90 Base) MCG/ACT inhaler Commonly known as: Ventolin HFA Inhale 2 puffs into the lungs every 6 (six) hours as needed for shortness of breath or wheezing.   allopurinol 300 MG tablet Commonly known as: ZYLOPRIM Take 300 mg by mouth daily.   amiodarone 400 MG tablet Commonly known as: PACERONE Take 1 tablet (400 mg total) by mouth 2 (two) times daily for 7 days.   amiodarone 200 MG tablet Commonly known as: PACERONE Take 1 tablet (200 mg total) by mouth 2 (two) times daily for 23 days. Start taking on: Apr 05, 2021   atorvastatin 40 MG tablet Commonly known as: LIPITOR Take 40 mg by mouth daily.   cetirizine 10 MG tablet Commonly known as: ZYRTEC Take 10 mg by mouth daily.   DULoxetine 60 MG capsule Commonly known as: CYMBALTA Take 120 mg by mouth daily.   Eliquis 5 MG Tabs tablet Generic drug: apixaban Take 1 tablet by mouth 2 (two) times daily.   furosemide 20 MG tablet Commonly known as: LASIX Take 20 mg by mouth.   gabapentin 400 MG capsule Commonly known as: NEURONTIN Take 800 mg by mouth 3 (three) times daily.    HYDROcodone-acetaminophen 5-325 MG tablet Commonly known as: NORCO/VICODIN Take 1 tablet by mouth 2 (two) times daily as needed for pain.   magnesium oxide 400 MG tablet Commonly known as: MAG-OX Take 400 mg by mouth daily.   metoprolol succinate 50 MG 24 hr tablet Commonly known as: TOPROL-XL Take 50 mg by mouth daily.   naphazoline-glycerin 0.012-0.2 % Soln Commonly known as: CLEAR EYES REDNESS 1-2 drops 4 (four) times daily as needed for eye irritation.   olopatadine 0.1 % ophthalmic solution Commonly known as: PATANOL Place 1 drop  into both eyes in the morning and at bedtime.   One-A-Day Womens 50 Plus Tabs Take 1 tablet by mouth daily.   sacubitril-valsartan 97-103 MG Commonly known as: ENTRESTO Take 1 tablet by mouth 2 (two) times daily.   spironolactone 25 MG tablet Commonly known as: ALDACTONE Take 25 mg by mouth daily.   Stiolto Respimat 2.5-2.5 MCG/ACT Aers Generic drug: Tiotropium Bromide-Olodaterol Take 2 puffs by mouth daily.   tamoxifen 20 MG tablet Commonly known as: NOLVADEX Take 20 mg by mouth daily.         Time coordinating discharge: 28 minutes  Signed:  Zacaria Pousson  [facility] 03/29/2021, 12:38 PM   Pager on [email or web address]. If 7PM-7AM, please contact night-coverage at [email or web address]

## 2021-03-29 NOTE — Progress Notes (Signed)
Progress Note  Patient Name: Sophia Andrews Date of Encounter: 03/29/2021  [facility] Cardiologist: [facility] cardiology  Subjective   No further episodes of atrial fibrillation, tachycardia, ICD shock.   Inpatient Medications    Scheduled Meds: . allopurinol  300 mg Oral Daily  . [START ON 04/05/2021] amiodarone  200 mg Oral BID  . amiodarone  400 mg Oral BID  . apixaban  5 mg Oral BID  . atorvastatin  40 mg Oral Daily  . furosemide  20 mg Oral Daily  . gabapentin  800 mg Oral TID  . metoprolol succinate  50 mg Oral Daily  . sacubitril-valsartan  1 tablet Oral BID  . spironolactone  25 mg Oral Daily  . tamoxifen  20 mg Oral Daily  . tiotropium  18 mcg Inhalation Daily   Continuous Infusions:  PRN Meds: acetaminophen **OR** acetaminophen, albuterol, HYDROcodone-acetaminophen   Vital Signs    Vitals:   03/29/21 0736 03/29/21 0918 03/29/21 1001 03/29/21 1130  BP: (!) 164/150  (!) 130/107 (!) 123/93  Pulse: 79  83 87  Resp: 16  18 18   Temp: 97.9 F (36.6 C)   97.9 F (36.6 C)  TempSrc: Oral   Oral  SpO2: 95%  90% 95%  Weight:  110.6 kg      Intake/Output Summary (Last 24 hours) at 03/29/2021 1341 Last data filed at 03/29/2021 8341 Gross per 24 hour  Intake 480 ml  Output 400 ml  Net 80 ml   Last 3 Weights 03/29/2021 03/28/2021 03/28/2021  Weight (lbs) 243 lb 12.8 oz 241 lb 12.8 oz 243 lb 12.8 oz  Weight (kg) 110.587 kg 109.68 kg 110.587 kg      Telemetry    Sinus rhythm,  PVCs- Personally Reviewed  ECG    no new tracing obtained- Personally Reviewed  Physical Exam   GEN: No acute distress.   Neck: No JVD Cardiac: RRR, no murmurs, rubs, or gallops.  Respiratory: Clear to auscultation bilaterally. GI: Soft, nontender, non-distended  MS: No edema; No deformity. Neuro:  Nonfocal  Psych: Normal affect   Labs    High Sensitivity Troponin:   Recent Labs  Lab 03/08/21 1136 03/08/21 1307 03/27/21 0812 03/27/21 1010 03/28/21 0535  TROPONINIHS 124*  269* 65* 610* 260*      Chemistry Recent Labs  Lab 03/27/21 0812 03/28/21 0535 03/29/21 0407  NA 139 139 142  K 3.9 4.1 4.2  CL 106 104 107  CO2 20* 24 26  GLUCOSE 136* 124* 137*  BUN 30* 40* 39*  CREATININE 1.23* 1.54* 1.43*  CALCIUM 8.6* 9.0 9.3  PROT 6.8  --   --   ALBUMIN 3.7  --   --   AST 33  --   --   ALT 21  --   --   ALKPHOS 55  --   --   BILITOT 1.1  --   --   GFRNONAA 47* 36* 39*  ANIONGAP 13 11 9      Hematology Recent Labs  Lab 03/27/21 0812 03/28/21 0535 03/29/21 0407  WBC 13.5* 12.2* 12.0*  RBC 3.63* 3.70* 3.70*  HGB 12.4 12.6 12.4  HCT 38.4 40.0 39.9  MCV 105.8* 108.1* 107.8*  MCH 34.2* 34.1* 33.5  MCHC 32.3 31.5 31.1  RDW 14.6 14.7 14.6  PLT 235 212 226    BNPNo results for input(s): BNP, PROBNP in the last 168 hours.   DDimer No results for input(s): DDIMER in the last 168 hours.   Radiology  No results found.  Cardiac Studies   Echo 07/2019 1. Left ventricular ejection fraction, by visual estimation, is 30 to  35%. The left ventricle has normal function. Mildly increased left  ventricular size. There is no left ventricular hypertrophy.Global  hypokinesis, unable to exclude regional wall  motion abnormality.  2. Left ventricular diastolic Doppler parameters are consistent with  impaired relaxation pattern of LV diastolic filling.  3. Global right ventricle has normal systolic function.The right  ventricular size is normal. No increase in right ventricular wall  thickness.  4. Left atrial size was mildly dilated.  5. Mild mitral valve regurgitation.  6. A pacer wire is visualized.   Patient Profile     70 y.o. female with history of COPD, nonischemic cardiomyopathy, AICD implant 2015, OSA presenting due to ICD shock x4.  Assessment & Plan    1.  ICD shock x4 -Secondary to underlying A. fib RVR. -Maintaining sinus rhythm on amiodarone -Stop IV amiodarone, start p.o. amiodarone 400 mg twice daily.  2.  Paroxysmal A.  Fib -Currently in sinus -Amiodarone 400 mg twice daily, Toprol-XL, Eliquis  3.  Nonischemic cardiomyopathy, EF 35% -Toprol-XL, Entresto, Aldactone Lasix 20 mg daily.  Okay for patient to be discharged on current medications including p.o. amiodarone.  Has appointment with primary cardiologist in 2 days.  Close follow-up as outpatient recommended.  Total encounter time 35 minutes  Greater than 50% was spent in counseling and coordination of care with the patient    Signed, Kate Sable, MD  03/29/2021, 1:41 PM

## 2021-03-29 NOTE — Plan of Care (Signed)

## 2021-03-29 NOTE — Progress Notes (Signed)
Pt is tolerating amio drip. VSS. No c/o pain. Will continue to monitor.

## 2021-03-31 LAB — CALCIUM, IONIZED: Calcium, Ionized, Serum: 4.9 mg/dL (ref 4.5–5.6)

## 2021-09-24 ENCOUNTER — Other Ambulatory Visit: Payer: Self-pay

## 2021-09-24 ENCOUNTER — Ambulatory Visit (INDEPENDENT_AMBULATORY_CARE_PROVIDER_SITE_OTHER): Payer: 59

## 2021-09-24 ENCOUNTER — Ambulatory Visit
Admission: EM | Admit: 2021-09-24 | Discharge: 2021-09-24 | Disposition: A | Discharge status: Home / Self Care | Payer: 59 | Attending: Emergency Medicine | Admitting: Emergency Medicine

## 2021-09-24 ENCOUNTER — Encounter: Payer: Self-pay | Admitting: Emergency Medicine

## 2021-09-24 DIAGNOSIS — R059 Cough, unspecified: Secondary | ICD-10-CM | POA: Diagnosis not present

## 2021-09-24 DIAGNOSIS — R0602 Shortness of breath: Secondary | ICD-10-CM

## 2021-09-24 DIAGNOSIS — N179 Acute kidney failure, unspecified: Secondary | ICD-10-CM | POA: Diagnosis present

## 2021-09-24 LAB — CBC WITH DIFFERENTIAL/PLATELET
Abs Immature Granulocytes: 0.2 10*3/uL — ABNORMAL HIGH (ref 0.00–0.07)
Basophils Absolute: 0.1 10*3/uL (ref 0.0–0.1)
Basophils Relative: 1 %
Eosinophils Absolute: 0.1 10*3/uL (ref 0.0–0.5)
Eosinophils Relative: 1 %
HCT: 43.9 % (ref 36.0–46.0)
Hemoglobin: 13.8 g/dL (ref 12.0–15.0)
Immature Granulocytes: 2 %
Lymphocytes Relative: 33 %
Lymphs Abs: 3.2 10*3/uL (ref 0.7–4.0)
MCH: 33.4 pg (ref 26.0–34.0)
MCHC: 31.4 g/dL (ref 30.0–36.0)
MCV: 106.3 fL — ABNORMAL HIGH (ref 80.0–100.0)
Monocytes Absolute: 1.1 10*3/uL — ABNORMAL HIGH (ref 0.1–1.0)
Monocytes Relative: 11 %
Neutro Abs: 5 10*3/uL (ref 1.7–7.7)
Neutrophils Relative %: 52 %
Platelets: 219 10*3/uL (ref 150–400)
RBC: 4.13 MIL/uL (ref 3.87–5.11)
RDW: 13.3 % (ref 11.5–15.5)
WBC: 9.7 10*3/uL (ref 4.0–10.5)
nRBC: 0 % (ref 0.0–0.2)

## 2021-09-24 LAB — COMPREHENSIVE METABOLIC PANEL
ALT: 16 U/L (ref 0–44)
AST: 27 U/L (ref 15–41)
Albumin: 4.2 g/dL (ref 3.5–5.0)
Alkaline Phosphatase: 82 U/L (ref 38–126)
Anion gap: 13 (ref 5–15)
BUN: 56 mg/dL — ABNORMAL HIGH (ref 8–23)
CO2: 25 mmol/L (ref 22–32)
Calcium: 9.5 mg/dL (ref 8.9–10.3)
Chloride: 100 mmol/L (ref 98–111)
Creatinine, Ser: 3.18 mg/dL — ABNORMAL HIGH (ref 0.44–1.00)
GFR, Estimated: 15 mL/min — ABNORMAL LOW (ref >60)
Glucose, Bld: 87 mg/dL (ref 70–99)
Potassium: 4.4 mmol/L (ref 3.5–5.1)
Sodium: 138 mmol/L (ref 135–145)
Total Bilirubin: 0.9 mg/dL (ref 0.3–1.2)
Total Protein: 7.6 g/dL (ref 6.5–8.1)

## 2021-09-24 LAB — TROPONIN I (HIGH SENSITIVITY): Troponin I (High Sensitivity): 9 ng/L (ref <18)

## 2021-09-24 NOTE — Discharge Instructions (Addendum)
As we discussed there is been a marked change in your renal function versus what it was 3 days ago at [facility].  This could account for your low blood pressure, or it may be a result of your low blood pressure.  In any event, you need to be evaluated in the emergency department.

## 2021-09-24 NOTE — ED Triage Notes (Signed)
Pt states she noticed her BP was very low yesterday. She states it was 68/48 at home. She states having dizziness yesterday. She states her bottom lip is also numb and started about 45 minutes ago. She states she is on antibiotic for a UTI. Denies fever. Denies chest pain and states her shortness of breath is at baseline.

## 2021-09-24 NOTE — ED Provider Notes (Signed)
[facility]    CSN: [id number] Arrival date & time: 09/24/21  1657      History   Chief Complaint Chief Complaint  Patient presents with   Hypotension    68/48   Dizziness    HPI Sophia Andrews is a 70 y.o. female.   HPI  70 year old female here for evaluation of low blood pressure.  Patient reports that she checked her blood pressure at home yesterday and it was in the 60s over 40s.  She also had her home health aide check it and she was getting the same number.  She called her primary care doctor yesterday and she was advised to go to the hospital for evaluation which she did not do.  She reports that she laid down and went to sleep.  She continued to have low blood pressure today and decided to come in to be evaluated.  This low blood pressure has also been associated with dizziness and fatigue.  She has shortness of breath but she attributes this to her COPD and states that is not increased of her baseline.  She denies any chest pain, syncope, nausea or vomiting, swelling of her feet or lower legs, or UTI symptoms.  She was recently in the hospital for treatment of a UTI and is currently still taking oral antibiotics.  When asking about fluid consumption she reports that she drinks 2 to 3 cups of water daily.  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      Home Medications    Prior to Admission medications   Medication Sig Start Date End Date Taking? Authorizing Provider  acetaminophen (TYLENOL) 650 MG CR tablet Take 1,300 mg by mouth 3 (three) times daily. 04/25/20   [provider]  albuterol (VENTOLIN HFA) 108 (90 Base) MCG/ACT inhaler Inhale 2 puffs into the lungs every 6 (six) hours as needed for shortness of breath or wheezing. 11/26/19   Chase Picket, MD  allopurinol (ZYLOPRIM) 300 MG tablet Take 300 mg by mouth daily.     [provider]  amiodarone (PACERONE) 200 MG tablet Take 1 tablet (200 mg total) by mouth 2 (two) times daily for 23 days. 04/05/21 04/28/21  Jennye Boroughs, MD  amiodarone (PACERONE) 400 MG tablet Take 1 tablet (400 mg total) by mouth 2 (two) times daily for 7 days. 03/29/21  04/05/21  Jennye Boroughs, MD  atorvastatin (LIPITOR) 40 MG tablet Take 40 mg by mouth daily.    [provider]  cetirizine (ZYRTEC) 10 MG tablet Take 10 mg by mouth daily. 02/24/21   [provider]  DULoxetine (CYMBALTA) 60 MG capsule Take 120 mg by mouth daily.     [provider]  ELIQUIS 5 MG TABS tablet Take 1 tablet by mouth 2 (two) times daily. 03/20/21   [provider]  furosemide (LASIX) 20 MG tablet Take 20 mg by mouth.     [provider]  gabapentin (NEURONTIN) 400 MG capsule Take 800 mg by mouth 3 (three) times daily.     [provider]  HYDROcodone-acetaminophen (NORCO/VICODIN) 5-325  MG tablet Take 1 tablet by mouth 2 (two) times daily as needed for pain. 06/22/19   [provider]  magnesium oxide (MAG-OX) 400 MG tablet Take 400 mg by mouth daily.    [provider]  metoprolol succinate (TOPROL-XL) 50 MG 24 hr tablet Take 50 mg by mouth daily.     [provider]  Multiple Vitamins-Minerals (ONE-A-DAY WOMENS 50 PLUS) TABS Take 1 tablet by mouth daily.    [provider]  naphazoline-glycerin (CLEAR EYES) 0.012-0.2 % SOLN 1-2 drops 4 (four) times daily as needed for eye irritation.    [provider]  olopatadine (PATANOL) 0.1 % ophthalmic solution Place 1 drop into both eyes in the morning and at bedtime. 01/23/21 01/23/22  [provider]  sacubitril-valsartan (ENTRESTO) 97-103 MG Take 1 tablet by mouth 2 (two) times daily. 12/11/18   [provider]  spironolactone (ALDACTONE) 25 MG tablet Take 25 mg by mouth daily. Patient not taking: No sig reported    [provider]  STIOLTO RESPIMAT 2.5-2.5 MCG/ACT AERS Take 2 puffs by mouth daily. 02/24/21   [provider]  tamoxifen (NOLVADEX) 20 MG tablet Take 20 mg by mouth daily.    [provider]    Family History Family History  Problem Relation Age of Onset   Asthma Mother    Heart disease Mother    Kidney disease Mother    Pancreatic cancer Father     Social History Social History   Tobacco Use   Smoking status: Former    Types: Cigarettes    Quit date: 10/29/2009    Years since quitting: 11.9   Smokeless tobacco: Never  Vaping Use   Vaping Use: Never used  Substance Use Topics   Alcohol use: Not Currently   Drug use: Never     Allergies   Sulfa antibiotics, Ambien [zolpidem tartrate], Lyrica [pregabalin], and Tetanus toxoids   Review of Systems Review of Systems  Constitutional:  Positive for fatigue. Negative for activity change and appetite change.  Respiratory:  Positive for cough and shortness of breath.  Negative for wheezing.   Cardiovascular:  Negative for chest pain, palpitations and leg swelling.  Gastrointestinal:  Negative for nausea and vomiting.  Skin:  Negative for rash.  Hematological: Negative.   Psychiatric/Behavioral: Negative.      Physical Exam Triage Vital Signs ED Triage Vitals  Enc Vitals Group     BP 09/24/21 1727 (!) 96/51     Pulse Rate 09/24/21 1727 64     Resp 09/24/21 1727 18     Temp 09/24/21 1727 98.3 F (36.8 C)     Temp Source 09/24/21 1727 Oral     SpO2 09/24/21 1727 95 %     Weight 09/24/21 1725  243 lb 13.3 oz (110.6 kg)     Height 09/24/21 1725 5\' 4"  (1.626 m)     Head Circumference --      Peak Flow --      Pain Score 09/24/21 1725 0     Pain Loc --      Pain Edu? --      Excl. in [place]? --    No data found.  Updated Vital Signs BP (!) 96/51 (BP Location: Left Arm)   Pulse 64   Temp 98.3 F (36.8 C) (Oral)   Resp 18   Ht 5\' 4"  (1.626 m)   Wt 243 lb 13.3 oz (110.6 kg)   SpO2 95%   BMI 41.85 kg/m   Visual Acuity Right Eye Distance:   Left Eye Distance:   Bilateral Distance:    Right Eye Near:   Left Eye Near:    Bilateral Near:     Physical Exam Vitals and nursing note reviewed.  Constitutional:      General: She is not in acute distress.    Appearance: Normal appearance. She is not ill-appearing.  HENT:     Head: Normocephalic and atraumatic.  Eyes:     General: No scleral icterus.    Extraocular Movements: Extraocular movements intact.     Pupils: Pupils are equal, round, and reactive to light.  Cardiovascular:     Rate and Rhythm: Normal rate and regular rhythm.     Pulses: Normal pulses.     Heart sounds: Normal heart sounds. No murmur heard.   No gallop.  Musculoskeletal:     Right lower leg: Edema present.     Left lower leg: Edema present.  Skin:    General: Skin is warm and dry.     Capillary Refill: Capillary refill takes less than 2 seconds.     Findings: No erythema.  Neurological:     General: No focal  deficit present.     Mental Status: She is alert and oriented to person, place, and time.  Psychiatric:        Mood and Affect: Mood normal.        Behavior: Behavior normal.        Thought Content: Thought content normal.        Judgment: Judgment normal.     UC Treatments / Results  Labs (all labs ordered are listed, but only abnormal results are displayed) Labs Reviewed  CBC WITH DIFFERENTIAL/PLATELET - Abnormal; Notable for the following components:      Result Value   MCV 106.3 (*)    Monocytes Absolute 1.1 (*)    Abs Immature Granulocytes 0.20 (*)    All other components within normal limits  COMPREHENSIVE METABOLIC PANEL - Abnormal; Notable for the following components:   BUN 56 (*)    Creatinine, Ser 3.18 (*)    GFR, Estimated 15 (*)    All other components within normal limits  TROPONIN I (HIGH SENSITIVITY)    EKG   Radiology DG Chest 2 View  Result Date: 09/24/2021 CLINICAL DATA:  Cough, shortness of breath EXAM: CHEST - 2 VIEW COMPARISON:  03/27/2021 FINDINGS: Stable positioning of a single lead left-sided implanted cardiac device. Heart size is upper limits of normal, unchanged. No focal airspace consolidation, pleural effusion, or pneumothorax. Degenerative changes of the thoracic spine. IMPRESSION: No active cardiopulmonary disease. Electronically Signed   By: Davina Poke D.O.   On: 09/24/2021 18:32    Procedures Procedures (including critical care time)  Medications Ordered in UC Medications - No data to display  Initial Impression / Assessment and Plan / UC Course  I have reviewed the triage vital signs and the nursing notes.  Pertinent labs & imaging results that were available during my care of the patient were reviewed by me and considered in my medical decision making (see chart for details).  Is a nontoxic-appearing 70 year old female here for evaluation of low blood pressure as outlined HPI above.  Patient reports that she first noticed low  blood pressure yesterday when she took it with her machine and she was experiencing the 60s over 40s.  This was verified by her home health aide using the same monitor.  She contacted her primary care doctor and was told to be evaluated at the hospital but she did not go.  She is here today because she is continuing to have low blood pressure symptoms, dizziness, shortness of breath, and fatigue.  She believes that the shortness of breath is related to her COPD and does not in decayed that is increased over her baseline.  The fatigue is new as is the dizziness been going on for the past 2 days.  She states she has not any chest pain or syncope.  No visual disturbances.  She has a cough but reports that this is baseline as well.  Patient's physical exam reveals pupils are equal round and reactive and EOM is intact.  Patient is alert and oriented x3 and can answer all questions appropriately though she is a poor historian.  Cardiopulmonary dam reveals clear lung sounds in all fields of the are diminished due to body habitus.  Lower extremities have +1 pitting edema from ankles to proximal calves.  No erythema or ecchymosis noted.  Patient is currently under antibiotic therapy for UTI.  She is unaware of what her blood pressure medication is but states that she is taking her medication.  When asked about water consumption she reports that she is on fluid restriction due to CHF but reports that she can have equivalent of 4 soda bottles a day.  She reports that she drinks about 2 to 3 cups a day.  Patient's dizziness may be associated with dehydration versus electrolyte imbalance, versus CHF.  Will check CBC, CMP, and chest x-ray.  Patient has a dual-chamber pacemaker so I am holding on EKG for now.  Chest x-ray independently reviewed and evaluated by me.  Impression: Patient has a enlarged heart with a dual-chamber pacemaker in place.  Lung fields to look a little fluffy without any definite infiltrate or effusion.   When compared to 03/27/2021 there is incremental improvement of the lung fields.  Radiology overread is pending. Radiology impression is no active cardiopulmonary disease.  CBC is unremarkable.  CMP shows a BUN of 56 and a creatinine of 3.18 which is an interval change from 09/21/2021 when her BUN was 18 and creatinine was 1.35.  Her GFR is estimated at 15 which is a decrease from 42 on 09/21/2021..  High-sensitivity troponin is 9.  This is an interval change in worsening of her renal function in a matter of 3 days.  This might be accounting for her low blood pressure as she may be on too much blood pressure medication and need renal dosing.  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  Report called to [facility]  the charge nurse in the ER at [facility]   Final Clinical Impressions(s) / UC Diagnoses   Final diagnoses:  AKI (acute kidney injury) [facility])     Discharge Instructions      As we discussed there is been a marked change in your renal function versus what it was 3 days ago at [facility].  This could account for your low blood pressure, or it may be a result of your low blood pressure.  In any event, you need to be evaluated in the emergency department.     ED Prescriptions   None    PDMP not reviewed this encounter.   Margarette Canada, NP 09/24/21 [phone]

## 2021-12-10 ENCOUNTER — Other Ambulatory Visit: Payer: Self-pay

## 2021-12-10 ENCOUNTER — Ambulatory Visit
Admission: EM | Admit: 2021-12-10 | Discharge: 2021-12-10 | Disposition: A | Discharge status: Home / Self Care | Payer: 59 | Attending: Physician Assistant | Admitting: Physician Assistant

## 2021-12-10 ENCOUNTER — Ambulatory Visit (INDEPENDENT_AMBULATORY_CARE_PROVIDER_SITE_OTHER): Payer: 59

## 2021-12-10 DIAGNOSIS — R059 Cough, unspecified: Secondary | ICD-10-CM | POA: Diagnosis not present

## 2021-12-10 DIAGNOSIS — R062 Wheezing: Secondary | ICD-10-CM

## 2021-12-10 DIAGNOSIS — R0600 Dyspnea, unspecified: Secondary | ICD-10-CM | POA: Diagnosis not present

## 2021-12-10 DIAGNOSIS — R5383 Other fatigue: Secondary | ICD-10-CM

## 2021-12-10 DIAGNOSIS — G8929 Other chronic pain: Secondary | ICD-10-CM | POA: Diagnosis present

## 2021-12-10 DIAGNOSIS — Z1152 Encounter for screening for COVID-19: Secondary | ICD-10-CM | POA: Diagnosis present

## 2021-12-10 DIAGNOSIS — M25561 Pain in right knee: Secondary | ICD-10-CM | POA: Diagnosis present

## 2021-12-10 LAB — CBC WITH DIFFERENTIAL/PLATELET
Abs Immature Granulocytes: 0.04 10*3/uL (ref 0.00–0.07)
Basophils Absolute: 0.1 10*3/uL (ref 0.0–0.1)
Basophils Relative: 1 %
Eosinophils Absolute: 0 10*3/uL (ref 0.0–0.5)
Eosinophils Relative: 1 %
HCT: 42.7 % (ref 36.0–46.0)
Hemoglobin: 13.3 g/dL (ref 12.0–15.0)
Immature Granulocytes: 1 %
Lymphocytes Relative: 30 %
Lymphs Abs: 2.6 10*3/uL (ref 0.7–4.0)
MCH: 33.2 pg (ref 26.0–34.0)
MCHC: 31.1 g/dL (ref 30.0–36.0)
MCV: 106.5 fL — ABNORMAL HIGH (ref 80.0–100.0)
Monocytes Absolute: 0.8 10*3/uL (ref 0.1–1.0)
Monocytes Relative: 9 %
Neutro Abs: 5.1 10*3/uL (ref 1.7–7.7)
Neutrophils Relative %: 58 %
Platelets: 223 10*3/uL (ref 150–400)
RBC: 4.01 MIL/uL (ref 3.87–5.11)
RDW: 13.4 % (ref 11.5–15.5)
WBC: 8.6 10*3/uL (ref 4.0–10.5)
nRBC: 0 % (ref 0.0–0.2)

## 2021-12-10 LAB — COMPREHENSIVE METABOLIC PANEL
ALT: 23 U/L (ref 0–44)
AST: 22 U/L (ref 15–41)
Albumin: 3.9 g/dL (ref 3.5–5.0)
Alkaline Phosphatase: 85 U/L (ref 38–126)
Anion gap: 7 (ref 5–15)
BUN: 21 mg/dL (ref 8–23)
CO2: 30 mmol/L (ref 22–32)
Calcium: 9.4 mg/dL (ref 8.9–10.3)
Chloride: 100 mmol/L (ref 98–111)
Creatinine, Ser: 1.37 mg/dL — ABNORMAL HIGH (ref 0.44–1.00)
GFR, Estimated: 41 mL/min — ABNORMAL LOW (ref >60)
Glucose, Bld: 84 mg/dL (ref 70–99)
Potassium: 4.5 mmol/L (ref 3.5–5.1)
Sodium: 137 mmol/L (ref 135–145)
Total Bilirubin: 1.3 mg/dL — ABNORMAL HIGH (ref 0.3–1.2)
Total Protein: 7.3 g/dL (ref 6.5–8.1)

## 2021-12-10 LAB — URINALYSIS, ROUTINE W REFLEX MICROSCOPIC
Bilirubin Urine: NEGATIVE
Glucose, UA: NEGATIVE mg/dL
Hgb urine dipstick: NEGATIVE
Ketones, ur: NEGATIVE mg/dL
Leukocytes,Ua: NEGATIVE
Nitrite: NEGATIVE
Protein, ur: NEGATIVE mg/dL
Specific Gravity, Urine: 1.015 (ref 1.005–1.030)
pH: 5.5 (ref 5.0–8.0)

## 2021-12-10 MED ORDER — BENZONATATE 100 MG PO CAPS: 100.0000 mg | ORAL_CAPSULE | Freq: Three times a day (TID) | ORAL | 0 refills | Status: DC | PRN

## 2021-12-10 NOTE — ED Notes (Signed)
Pt request to use BR before triage, also wants urine check for UTI while here

## 2021-12-10 NOTE — Discharge Instructions (Addendum)
Your blood tests did not show anything abnormal. Your urine test did not show any infection of the urinary tract. Your chest XR did not show any pneumonia.   Your COVID test test results are pending.   Pain management for your need will need to be treated by your primary care provider and/or orthopedic specialist.    You should drink enough fluids to stay hydrated.   I have ordered you some medicine that you can take as needed for cough.   Be sure to use your inhaler as needed for shortness of breath or wheezing.   It's important to use your CPAP every night   Be sure to follow-up closely with your primary care provider and cardiologist. Please call them today to make an appointment for their first available spots.   Go the the ED immediately if you get worse or develop any new symptoms that are of concern.

## 2021-12-10 NOTE — ED Provider Notes (Signed)
[facility]    CSN: [id number] Arrival date & time: 12/10/21  1150      History   Chief Complaint Chief Complaint  Patient presents with   Multiple Complaitns     HPI Sophia Andrews is a 71 y.o. female.   Subjective:  Sophia Andrews is a 71 y.o. female who presents with multiple complaints.    RIGHT KNEE PAIN: Onset was "years" ago and has been getting worse especially over the past couple of weeks. She has been evaluated for this several times in the past. She was told that she should have surgery for the knee but is not a surgical candidate due to mulitple medical problems including hx CHF, pacemaker/defibillator. She has been getting steroid injections in the knee intermittently as well and states that it doesn't help very much. She was prvisoly on oxycodone for the knee pain but is no longer taking it. Patient denies any new injury or change in the pain. She hasn't tried anything at home for the pain but is requesting that we prescribe her something.   FATIGUE: This has been a chronic issue for the patient but she reports that its been "more than usual" over the past couple of weeks. Symptoms of her fatigue have included diffuse soft tissue aches and pains, general malaise, headaches, and her usual neuropathy/arthritis pain of the legs/feet. Symptoms tend to be worse at night. Her fatigue is worse during the day. She is only able to be productive a few hours in the morning before the fatigue "kicks in." She was recently started on CPAP for OSA and admits to intermittently using the machine.   WHEEZING: She has noticed increased wheezing that occurs mainly at night for the past two weeks. She also endorses chest tightness, dyspnea and non-productive cough. She denies chest pain, palpitations or LE swelling. She has a history of heart failure and COPD. Former smoker, quit about 10 years ago. She denies any known triggers. She hasn't tried anything for her symptoms.    CONCERN FOR UTI: patient reports that she has frequent UTIs and wants to be checked for acute infection. She reports decreased urination, nausea and low back pain. No dysuria, urinary frequency or malodorous urine.   Patient denies history of COVID. She has been completely vaccinated against COVID with all available boosters. She lives alone. Uses a walker to assist in mobility. She has an aide that comes to the home Monday-Friday from 10a-1p. She has family that lives in the area that checks in on her as well.   The following portions of the patient's history were reviewed and updated as appropriate: allergies, current medications, past family history, past medical history, past social history, past surgical history, and problem list.                           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   AF (paroxysmal atrial fibrillation) ([place]) 03/27/2021   Defibrillator discharge 03/27/2021   Atypical chest pain 03/27/2021   Elevated troponin 03/27/2021   Leukocytosis 03/27/2021  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      Home Medications    Prior to Admission medications   Medication Sig Start Date End Date Taking? Authorizing Provider  acetaminophen (TYLENOL) 650 MG CR tablet Take 1,300 mg by mouth 3 (three) times daily. 04/25/20  Yes [provider]  albuterol (VENTOLIN HFA) 108 (90 Base) MCG/ACT inhaler Inhale 2 puffs into the lungs every 6 (six) hours as needed for shortness of breath or wheezing. 11/26/19  Yes Lamptey, Myrene Galas, MD  allopurinol (ZYLOPRIM) 300 MG tablet Take 300 mg by mouth daily.    Yes [provider]  amiodarone (PACERONE) 200 MG tablet Take 1 tablet (200 mg total) by mouth 2 (two) times daily for 23 days. 04/05/21 12/10/21 Yes Jennye Boroughs, MD  atorvastatin (LIPITOR) 40 MG tablet Take 40 mg by mouth daily.   Yes [provider]  benzonatate (TESSALON PERLES) 100 MG capsule Take 1 capsule (100 mg total) by mouth 3 (three) times daily as needed for cough. 12/10/21  Yes Enrique Sack, FNP  cetirizine (ZYRTEC) 10 MG tablet Take 10 mg by mouth daily. 02/24/21  Yes [provider]  DULoxetine (CYMBALTA) 60 MG capsule Take 120 mg by mouth daily.    Yes [provider]  ELIQUIS 5 MG TABS tablet Take 1 tablet by mouth 2 (two) times daily. 03/20/21  Yes [provider]  furosemide (LASIX) 20 MG tablet Take 20 mg by mouth.    Yes [provider]  gabapentin (NEURONTIN) 400 MG capsule Take 800 mg by mouth 3 (three) times daily.    Yes [provider]  HYDROcodone-acetaminophen (NORCO/VICODIN) 5-325 MG tablet Take 1 tablet by mouth 2 (two) times daily as needed for pain. 06/22/19  Yes [provider]  metoprolol succinate (TOPROL-XL) 50 MG 24 hr tablet Take 50 mg by mouth daily.    Yes [provider]  naphazoline-glycerin (CLEAR EYES) 0.012-0.2 % SOLN 1-2 drops 4 (four) times daily as needed for eye irritation.   Yes [provider]  olopatadine (PATANOL) 0.1 % ophthalmic solution  Place 1 drop into both eyes in the morning and at bedtime. 01/23/21 01/23/22 Yes [provider]  [place] 2.5-2.5 MCG/ACT AERS Take 2 puffs by mouth daily. 02/24/21  Yes [provider]  amiodarone (PACERONE) 400 MG tablet Take 1 tablet (400 mg total) by mouth 2 (two) times daily for 7 days. 03/29/21 04/05/21  Jennye Boroughs, MD  magnesium oxide (MAG-OX) 400 MG tablet Take 400 mg by mouth daily.    [provider]  Multiple Vitamins-Minerals (ONE-A-DAY WOMENS 50 PLUS) TABS Take 1 tablet by mouth daily.    [provider]  sacubitril-valsartan (ENTRESTO) 97-103 MG Take 1 tablet by mouth 2 (two) times daily. 12/11/18   [provider]  spironolactone (ALDACTONE) 25 MG tablet Take 25 mg by mouth daily. Patient not taking: No sig reported    [provider]  tamoxifen (NOLVADEX) 20  MG tablet Take 20 mg by mouth daily.    [provider]    Family History Family History  Problem Relation Age of Onset   Asthma Mother    Heart disease Mother    Kidney disease Mother    Pancreatic cancer Father     Social History Social History   Tobacco Use   Smoking status: Former    Types: Cigarettes    Quit date: 10/29/2009    Years since quitting: 12.1   Smokeless tobacco: Never  Vaping Use   Vaping Use: Never used  Substance Use Topics   Alcohol use: Not Currently   Drug use: Never     Allergies   Sulfa antibiotics, Ambien [zolpidem tartrate], Lyrica [pregabalin], and Tetanus toxoids   Review of Systems Review of Systems  Constitutional:  Positive for activity change and fatigue. Negative for appetite change, chills, diaphoresis and fever.  HENT:  Negative for congestion, ear pain, postnasal drip, rhinorrhea, sinus pain, sneezing and sore throat.   Respiratory:  Positive for cough, chest tightness, shortness of breath and wheezing.   Cardiovascular:  Negative for chest pain, palpitations and leg swelling.  Gastrointestinal:   Positive for nausea. Negative for abdominal pain, diarrhea and vomiting.  Genitourinary:  Positive for decreased urine volume. Negative for dysuria, flank pain, frequency and vaginal discharge.  Musculoskeletal:  Positive for arthralgias, back pain, gait problem and myalgias. Negative for joint swelling.  Neurological:  Positive for weakness and headaches. Negative for numbness.  All other systems reviewed and are negative.   Physical Exam Triage Vital Signs ED Triage Vitals  Enc Vitals Group     BP 12/10/21 1240 117/64     Pulse Rate 12/10/21 1240 66     Resp 12/10/21 1240 (!) 96     Temp 12/10/21 1240 98.9 F (37.2 C)     Temp Source 12/10/21 1240 Oral     SpO2 12/10/21 1240 94 %     Weight 12/10/21 1244 235 lb (106.6 kg)     Height 12/10/21 1244 5\' 4"  (1.626 m)     Head Circumference --      Peak Flow --      Pain Score 12/10/21 1243 7     Pain Loc --      Pain Edu? --      Excl. in [place]? --    No data found.  Updated Vital Signs BP 117/64 (BP Location: Left Arm)    Pulse 66    Temp 98.9 F (37.2 C) (Oral)    Resp 20    Ht 5\' 4"  (1.626 m)    Wt 235 lb (106.6 kg)    SpO2 94%    BMI 40.34 kg/m   Visual Acuity Right Eye Distance:   Left Eye Distance:   Bilateral Distance:    Right Eye Near:   Left Eye Near:    Bilateral Near:     Physical Exam Vitals reviewed.  Constitutional:      General: She is not in acute distress.    Appearance: Normal appearance. She is not ill-appearing or toxic-appearing.  HENT:     Head: Normocephalic.     Nose: Nose normal.     Mouth/Throat:     Mouth: Mucous membranes are moist.  Eyes:     Conjunctiva/sclera: Conjunctivae normal.  Cardiovascular:     Rate and Rhythm: Normal rate.  Pulmonary:     Effort: Pulmonary effort is normal.     Breath sounds: Normal breath  sounds.  Abdominal:     Palpations: Abdomen is soft.  Musculoskeletal:        General: Normal range of motion.     Cervical back: Normal range of motion and neck  supple.     Lumbar back: Tenderness present. No swelling, edema or deformity.     Right knee: No swelling, effusion or erythema. Normal range of motion. Tenderness present.     Comments: Unable to perform SLR   Steady gait noted with rolling walker   Skin:    General: Skin is warm and dry.  Neurological:     General: No focal deficit present.     Mental Status: She is alert and oriented to person, place, and time.     UC Treatments / Results  Labs (all labs ordered are listed, but only abnormal results are displayed) Labs Reviewed  CBC WITH DIFFERENTIAL/PLATELET - Abnormal; Notable for the following components:      Result Value   MCV 106.5 (*)    All other components within normal limits  COMPREHENSIVE METABOLIC PANEL - Abnormal; Notable for the following components:   Creatinine, Ser 1.37 (*)    Total Bilirubin 1.3 (*)    GFR, Estimated 41 (*)    All other components within normal limits  SARS CORONAVIRUS 2 (TAT 6-24 HRS)  URINALYSIS, ROUTINE W REFLEX MICROSCOPIC    EKG   Radiology DG Chest 2 View  Result Date: 12/10/2021 CLINICAL DATA:  Cough.  Dyspnea. EXAM: CHEST - 2 VIEW COMPARISON:  09/24/2021 FINDINGS: Pacer/AICD device with tip at right ventricle. Midline trachea. Moderate cardiomegaly. No pleural effusion or pneumothorax. Azygous fissure. Lower lung predominant interstitial thickening is mild, new since 09/24/2021. No well-defined lobar consolidation. IMPRESSION: Cardiomegaly with development of pulmonary interstitial thickening since 09/24/2021. Favor pulmonary venous congestion. Electronically Signed   By: Abigail Miyamoto M.D.   On: 12/10/2021 14:00    Procedures Procedures (including critical care time)  Medications Ordered in UC Medications - No data to display  Initial Impression / Assessment and Plan / UC Course  I have reviewed the triage vital signs and the nursing notes.  Pertinent labs & imaging results that were available during my care of the patient  were reviewed by me and considered in my medical decision making (see chart for details).    71 yo female with a complex medical history presents with multiple complaints that have been ongoing for the past several weeks.  Informed patient that she should follow-up with her primary care for these concerns but for now we will check urinalysis, CBC, CMP, COVID screen and chest x-ray.    Lab work negative for anything acute. Renal function similar to previous studies and seem to be at baseline. UA negative for acute infection. CXR negative for acute infection but pulmonary venous congestion was noted. COVID test results pending. Pain management will be deferred to her PCP and/or orthopedic specialist.  Advised patient to drink plenty of fluids to stay hydrated. She should follow-up with her primary care provider and cardiologist soon. Advised to call ASAP to arrange an appointment. Discussed indications for immediate ED follow-up.  Today's evaluation has revealed no signs of a dangerous process. Discussed diagnosis with patient and/or guardian. Patient and/or guardian aware of their diagnosis, possible red flag symptoms to watch out for and need for close follow up. Patient and/or guardian understands verbal and written discharge instructions. Patient and/or guardian comfortable with plan and disposition.  Patient and/or guardian has a clear mental status  at this time, good insight into illness (after discussion and teaching) and has clear judgment to make decisions regarding their care  This care was provided during an unprecedented National Emergency due to the Novel Coronavirus (COVID-19) pandemic. COVID-19 infections and transmission risks place heavy strains on healthcare resources.  As this pandemic evolves, our facility, providers, and staff strive to respond fluidly, to remain operational, and to provide care relative to available resources and information. Outcomes are unpredictable and treatments are  without well-defined guidelines. Further, the impact of COVID-19 on all aspects of urgent care, including the impact to patients seeking care for reasons other than COVID-19, is unavoidable during this national emergency. At this time of the global pandemic, management of patients has significantly changed, even for non-COVID positive patients given high local and regional COVID volumes at this time requiring high healthcare system and resource utilization. The standard of care for management of both COVID suspected and non-COVID suspected patients continues to change rapidly at the local, regional, national, and global levels. This patient was worked up and treated to the best available but ever changing evidence and resources available at this current time.   Documentation was completed with the aid of voice recognition software. Transcription may contain typographical errors. Plan:    Final Clinical Impressions(s) / UC Diagnoses   Final diagnoses:  Chronic pain of right knee  Other fatigue  Wheezing  Encounter for screening for COVID-19     Discharge Instructions      Your blood tests did not show anything abnormal. Your urine test did not show any infection of the urinary tract. Your chest XR did not show any pneumonia.   Your COVID test test results are pending.   Pain management for your need will need to be treated by your primary care provider and/or orthopedic specialist.    You should drink enough fluids to stay hydrated.   I have ordered you some medicine that you can take as needed for cough.   Be sure to use your inhaler as needed for shortness of breath or wheezing.   It's important to use your CPAP every night   Be sure to follow-up closely with your primary care provider and cardiologist. Please call them today to make an appointment for their first available spots.   Go the the ED immediately if you get worse or develop any new symptoms that are of concern.       ED Prescriptions     Medication Sig Dispense Auth. Provider   benzonatate (TESSALON PERLES) 100 MG capsule Take 1 capsule (100 mg total) by mouth 3 (three) times daily as needed for cough. 20 capsule Enrique Sack, FNP      PDMP not reviewed this encounter.   Enrique Sack, [place] 12/10/21 1425

## 2021-12-10 NOTE — ED Triage Notes (Addendum)
Pt reports wheezing for 2 weeks, no getting better. Dry cough  Pt reports sleeping more then normal, even sitting up in upright positions  Pt reports hurts everywhere, headache and bodyaches.  Pt reports right knee pain, has hx of chronic knee pain, was denied knee sx.  Pt reports "everything I get sick I get a UTI, so I want it check"  Pt reports "I just do not feel well overall"  Pt also reports dizziness at times  Pt was not able to get in with PCP regarding her s/s. No home COVID test.

## 2021-12-11 LAB — SARS CORONAVIRUS 2 (TAT 6-24 HRS): SARS Coronavirus 2: NEGATIVE

## 2021-12-29 DIAGNOSIS — N1831 Chronic kidney disease, stage 3a: Secondary | ICD-10-CM | POA: Diagnosis present

## 2021-12-29 DIAGNOSIS — E039 Hypothyroidism, unspecified: Secondary | ICD-10-CM | POA: Diagnosis present

## 2022-04-30 ENCOUNTER — Ambulatory Visit
Admission: EM | Admit: 2022-04-30 | Discharge: 2022-04-30 | Disposition: A | Discharge status: Home / Self Care | Payer: 59 | Attending: Emergency Medicine | Admitting: Emergency Medicine

## 2022-04-30 ENCOUNTER — Other Ambulatory Visit: Payer: Self-pay

## 2022-04-30 ENCOUNTER — Encounter: Payer: Self-pay | Admitting: Emergency Medicine

## 2022-04-30 DIAGNOSIS — Z20822 Contact with and (suspected) exposure to covid-19: Secondary | ICD-10-CM | POA: Insufficient documentation

## 2022-04-30 DIAGNOSIS — Z4802 Encounter for removal of sutures: Secondary | ICD-10-CM | POA: Diagnosis not present

## 2022-04-30 DIAGNOSIS — S0101XA Laceration without foreign body of scalp, initial encounter: Secondary | ICD-10-CM | POA: Diagnosis not present

## 2022-04-30 DIAGNOSIS — S0101XD Laceration without foreign body of scalp, subsequent encounter: Secondary | ICD-10-CM | POA: Diagnosis not present

## 2022-04-30 NOTE — Discharge Instructions (Addendum)
Your laceration has appeared to heal well.  Continue to keep clean with soap and water.  We will contact you if your COVID comes back positive and we will prescribe molnupiravir.

## 2022-04-30 NOTE — ED Triage Notes (Signed)
Patient is here to have her staples from the top of her head removed.  Patient had her staples placed at [facility] on Mother[facility] day.  Patient states that she also wants a COVID test because her son has [place].  Patient denies any COVID symptoms.

## 2022-04-30 NOTE — ED Provider Notes (Signed)
HPI  SUBJECTIVE:  Sophia Andrews is a 71 y.o. female who presents for staple removal.  She had a fall 5/21 and sustained a laceration to her right scalp. Patient had 5 staples placed at the [facility] ED. She has no complaints.  States it is healing well.  No erythema, swelling, drainage, tenderness.  She is also requesting COVID testing.  She was exposed to her son 5 days ago, who was subsequently diagnosed with COVID.  She has no complaints.  No fevers, body aches, headaches, nasal congestion, rhinorrhea, loss of sense of smell or taste, change in her baseline cough or shortness of breath, nausea, vomiting, diarrhea, abdominal pain.  She got 4 doses of the COVID-vaccine.  Patient has an extensive medical history including atrial fibrillation on Eliquis, sepsis, COPD, diabetes, hypertension and peripheral neuropathy.  PCP: [facility]    Past Medical History:  Diagnosis Date   Cancer [facility])    Cardiac defibrillator in place    CHF (congestive heart failure) (HCC)    COPD (chronic obstructive pulmonary disease) (HCC)    Hypertension    OSA (obstructive sleep apnea)    Pacemaker     Past Surgical History:  Procedure Laterality Date   ABDOMINAL HYSTERECTOMY     JOINT REPLACEMENT     TONSILLECTOMY      Family History  Problem Relation Age of Onset   Asthma Mother    Heart disease Mother    Kidney disease Mother    Pancreatic cancer Father     Social History   Tobacco Use   Smoking status: Former    Types: Cigarettes    Quit date: 10/29/2009    Years since quitting: 12.5   Smokeless tobacco: Never  Vaping Use   Vaping Use: Never used  Substance Use Topics   Alcohol use: Not Currently   Drug use: Never    No current facility-administered medications for this encounter.  Current Outpatient Medications:    acetaminophen (TYLENOL) 650 MG CR tablet, Take 1,300 mg by mouth 3 (three) times daily., Disp: , Rfl:    albuterol (VENTOLIN HFA) 108 (90 Base) MCG/ACT inhaler,  Inhale 2 puffs into the lungs every 6 (six) hours as needed for shortness of breath or wheezing., Disp: 18 g, Rfl: 0   allopurinol (ZYLOPRIM) 300 MG tablet, Take 300 mg by mouth daily. , Disp: , Rfl:    amiodarone (PACERONE) 200 MG tablet, Take 1 tablet (200 mg total) by mouth 2 (two) times daily for 23 days., Disp: 46 tablet, Rfl: 0   amiodarone (PACERONE) 400 MG tablet, Take 1 tablet (400 mg total) by mouth 2 (two) times daily for 7 days., Disp: 14 tablet, Rfl: 0   atorvastatin (LIPITOR) 40 MG tablet, Take 40 mg by mouth daily., Disp: , Rfl:    benzonatate (TESSALON PERLES) 100 MG capsule, Take 1 capsule (100 mg total) by mouth 3 (three) times daily as needed for cough., Disp: 20 capsule, Rfl: 0   cetirizine (ZYRTEC) 10 MG tablet, Take 10 mg by mouth daily., Disp: , Rfl:    DULoxetine (CYMBALTA) 60 MG capsule, Take 120 mg by mouth daily. , Disp: , Rfl:    ELIQUIS 5 MG TABS tablet, Take 1 tablet by mouth 2 (two) times daily., Disp: , Rfl:    furosemide (LASIX) 20 MG tablet, Take 20 mg by mouth. , Disp: , Rfl:    gabapentin (NEURONTIN) 400 MG capsule, Take 800 mg by mouth 3 (three) times daily. , Disp: ,  Rfl:    HYDROcodone-acetaminophen (NORCO/VICODIN) 5-325 MG tablet, Take 1 tablet by mouth 2 (two) times daily as needed for pain., Disp: , Rfl:    magnesium oxide (MAG-OX) 400 MG tablet, Take 400 mg by mouth daily., Disp: , Rfl:    metoprolol succinate (TOPROL-XL) 50 MG 24 hr tablet, Take 50 mg by mouth daily. , Disp: , Rfl:    Multiple Vitamins-Minerals (ONE-A-DAY WOMENS 50 PLUS) TABS, Take 1 tablet by mouth daily., Disp: , Rfl:    naphazoline-glycerin (CLEAR EYES) 0.012-0.2 % SOLN, 1-2 drops 4 (four) times daily as needed for eye irritation., Disp: , Rfl:    sacubitril-valsartan (ENTRESTO) 97-103 MG, Take 1 tablet by mouth 2 (two) times daily., Disp: , Rfl:    spironolactone (ALDACTONE) 25 MG tablet, Take 25 mg by mouth daily. (Patient not taking: No sig reported), Disp: , Rfl:    [place]  [place] 2.5-2.5 MCG/ACT AERS, Take 2 puffs by mouth daily., Disp: , Rfl:    tamoxifen (NOLVADEX) 20 MG tablet, Take 20 mg by mouth daily., Disp: , Rfl:   Allergies  Allergen Reactions   Sulfa Antibiotics Rash and Shortness Of Breath   Ambien [Zolpidem Tartrate]    Lyrica [Pregabalin]    Tetanus Toxoids      ROS  As noted in HPI.   Physical Exam  BP (!) 99/57 (BP Location: Left Arm)   Pulse 63   Temp 98.2 F (36.8 C) (Oral)   Resp 15   Ht '5\' 4"'$  (1.626 m)   Wt 106.6 kg   SpO2 98%   BMI 40.34 kg/m   Constitutional: Well developed, well nourished, no acute distress Eyes:  EOMI, conjunctiva normal bilaterally HENT: Normocephalic, well-healed laceration right superior scalp with 5 staples intact.  No surrounding erythema, edema, tenderness or expressible purulent drainage   Respiratory: Normal inspiratory effort Cardiovascular: Normal rate GI: nondistended skin: No rash, skin intact Musculoskeletal: no deformities Neurologic: Alert & oriented x 3, no focal neuro deficits Psychiatric: Speech and behavior appropriate   ED Course   Medications - No data to display  Orders Placed This Encounter  Procedures   SARS CORONAVIRUS 2 (TAT 6-24 HRS) Anterior Nasal Swab    Standing Status:   Standing    Number of Occurrences:   1    No results found for this or any previous visit (from the past 24 hour(s)). No results found.  ED Clinical Impression  1. Laceration of scalp, initial encounter   2. Removal of staples   3. Close exposure to COVID-19 virus   4. Encounter for laboratory testing for COVID-19 virus      ED Assessment/Plan  Previous records reviewed.  As noted in HPI  1.  Healed scalp laceration.  Signs of infection.  Cleaned area with alcohol.  Removed 5 staples in their entirety without any bleeding or complications.  Patient tolerated procedure well.  2.  Close exposure to COVID encounter for COVID testing-currently asymptomatic .patient will need  Molnupiravir if COVID comes back positive due to multiple medical comorbidities.  Discussed labs, MDM, treatment plan, and plan for follow-up with patient. patient agrees with plan.   No orders of the defined types were placed in this encounter.     *This clinic note was created using Dragon dictation software. Therefore, there may be occasional mistakes despite careful proofreading.  ?    Melynda Ripple, MD 04/30/22 1153

## 2022-05-01 LAB — SARS CORONAVIRUS 2 (TAT 6-24 HRS): SARS Coronavirus 2: NEGATIVE

## 2022-09-12 ENCOUNTER — Ambulatory Visit
Admission: RE | Admit: 2022-09-12 | Discharge: 2022-09-12 | Disposition: A | Discharge status: Home / Self Care | Payer: 59 | Source: Ambulatory Visit | Attending: Physician Assistant | Admitting: Physician Assistant

## 2022-09-12 VITALS — BP 106/55 | HR 76 | Temp 98.3°F | Resp 18 | Ht 65.0 in | Wt 227.0 lb

## 2022-09-12 DIAGNOSIS — Z792 Long term (current) use of antibiotics: Secondary | ICD-10-CM | POA: Diagnosis not present

## 2022-09-12 DIAGNOSIS — J441 Chronic obstructive pulmonary disease with (acute) exacerbation: Secondary | ICD-10-CM

## 2022-09-12 DIAGNOSIS — Z79899 Other long term (current) drug therapy: Secondary | ICD-10-CM | POA: Diagnosis not present

## 2022-09-12 DIAGNOSIS — R051 Acute cough: Secondary | ICD-10-CM | POA: Diagnosis present

## 2022-09-12 DIAGNOSIS — R0602 Shortness of breath: Secondary | ICD-10-CM

## 2022-09-12 DIAGNOSIS — Z1152 Encounter for screening for COVID-19: Secondary | ICD-10-CM | POA: Insufficient documentation

## 2022-09-12 DIAGNOSIS — Z7952 Long term (current) use of systemic steroids: Secondary | ICD-10-CM | POA: Insufficient documentation

## 2022-09-12 LAB — RESP PANEL BY RT-PCR (FLU A&B, COVID) ARPGX2
Influenza A by PCR: NEGATIVE
Influenza B by PCR: NEGATIVE
SARS Coronavirus 2 by RT PCR: NEGATIVE

## 2022-09-12 MED ORDER — PROMETHAZINE-DM 6.25-15 MG/5ML PO SYRP: 5.0000 mL | ORAL_SOLUTION | Freq: Four times a day (QID) | ORAL | 0 refills | Status: DC | PRN

## 2022-09-12 MED ORDER — BENZONATATE 200 MG PO CAPS: 200.0000 mg | ORAL_CAPSULE | Freq: Three times a day (TID) | ORAL | 0 refills | Status: DC | PRN

## 2022-09-12 MED ORDER — AMOXICILLIN-POT CLAVULANATE 875-125 MG PO TABS
1.0000 | ORAL_TABLET | Freq: Two times a day (BID) | ORAL | 0 refills | Status: AC
Start: 2022-09-12 — End: 2022-09-19

## 2022-09-12 MED ORDER — PREDNISONE 20 MG PO TABS: 40.0000 mg | ORAL_TABLET | Freq: Every day | ORAL | 0 refills | Status: AC

## 2022-09-12 NOTE — ED Triage Notes (Signed)
Pt c/o cough and chest pain when coughing x3-4days  Pt asks for a covid test.   Pt has an inhaler at home and states that it does not help.

## 2022-09-12 NOTE — ED Provider Notes (Signed)
[facility]    CSN: [id number] Arrival date & time: 09/12/22  1102      History   Chief Complaint Chief Complaint  Patient presents with   Cough    HPI Sophia Andrews is a 71 y.o. female with history of COPD, CHF, OSA, hypertension, cardiomyopathy and presence of a cardiac defibrillator and A-fib.  Patient presents today for concerns about 3 to 4-day history of cough.  Reports that she feels pain in her chest when she coughs but not otherwise.  She says the cough is productive of yellowish sputum.  She also reports being more short of breath than normal.  She has not had any fevers, sore throat, nasal congestion.  Denies vomiting or diarrhea.  No sick contacts but she would like to be tested for COVID.  She has been using her inhaler at home and says that it does help.  She used it before coming to the urgent care today.  No other complaints.  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   AF (paroxysmal atrial fibrillation) ([place]) 03/27/2021   Defibrillator discharge 03/27/2021   Atypical chest pain 03/27/2021   Elevated troponin 03/27/2021   Leukocytosis 03/27/2021   Dilated cardiomyopathy ([place]) 03/27/2021   Severe sepsis ([place]) 08/15/2019   UTI (urinary tract infection) 08/15/2019   Chronic combined systolic (congestive) and diastolic (congestive) heart failure ([place]) 08/15/2019   AKI (acute kidney injury) ([place]) 08/15/2019   COPD (chronic obstructive pulmonary disease) ([place]) 08/15/2019   HTN (hypertension) 08/15/2019   OSA (obstructive sleep apnea) 08/15/2019    Past Surgical History:  Procedure Laterality Date   ABDOMINAL  HYSTERECTOMY     JOINT REPLACEMENT     TONSILLECTOMY      OB History   No obstetric history on file.      Home Medications    Prior to Admission medications   Medication Sig Start Date End Date Taking? Authorizing Provider  acetaminophen (TYLENOL) 650 MG CR tablet Take 1,300 mg by mouth 3 (three) times daily. 04/25/20  Yes [provider]  albuterol (VENTOLIN HFA) 108 (90 Base) MCG/ACT inhaler Inhale 2 puffs into the lungs every 6 (six) hours as needed for shortness of breath or wheezing. 11/26/19  Yes Lamptey, Myrene Galas, MD  allopurinol (ZYLOPRIM) 300 MG tablet Take 300 mg by mouth daily.    Yes [provider]  amoxicillin-clavulanate (AUGMENTIN) 875-125 MG tablet Take 1 tablet by mouth every 12 (twelve) hours for 7 days. 09/12/22 09/19/22 Yes Laurene Footman B, PA-C  atorvastatin (LIPITOR) 40 MG tablet Take 40 mg by mouth daily.   Yes [provider]  benzonatate (TESSALON) 200 MG capsule Take 1 capsule (200 mg total) by mouth 3 (three) times daily as needed for cough. 09/12/22  Yes Danton Clap, PA-C  DULoxetine (CYMBALTA) 60 MG capsule Take 120 mg by mouth daily.    Yes [provider]  ELIQUIS 5 MG TABS tablet Take 1 tablet by mouth 2 (two) times daily. 03/20/21  Yes [provider]  furosemide (LASIX) 20 MG tablet Take 20 mg by mouth.  Yes [provider]  gabapentin (NEURONTIN) 400 MG capsule Take 800 mg by mouth 3 (three) times daily.    Yes [provider]  HYDROcodone-acetaminophen (NORCO/VICODIN) 5-325 MG tablet Take 1 tablet by mouth 2 (two) times daily as needed for pain. 06/22/19  Yes [provider]  magnesium oxide (MAG-OX) 400 MG tablet Take 400 mg by mouth daily.   Yes [provider]  metoprolol succinate (TOPROL-XL) 50 MG 24 hr tablet Take 50 mg by mouth daily.    Yes [provider]  Multiple Vitamins-Minerals (ONE-A-DAY WOMENS 50 PLUS) TABS Take 1 tablet by mouth daily.   Yes  [provider]  naphazoline-glycerin (CLEAR EYES) 0.012-0.2 % SOLN 1-2 drops 4 (four) times daily as needed for eye irritation.   Yes [provider]  predniSONE (DELTASONE) 20 MG tablet Take 2 tablets (40 mg total) by mouth daily for 5 days. 09/12/22 09/17/22 Yes Danton Clap, PA-C  promethazine-dextromethorphan (PROMETHAZINE-DM) 6.25-15 MG/5ML syrup Take 5 mLs by mouth 4 (four) times daily as needed. 09/12/22  Yes Danton Clap, PA-C  sacubitril-valsartan (ENTRESTO) 97-103 MG Take 1 tablet by mouth 2 (two) times daily. 12/11/18  Yes [provider]  [place] 2.5-2.5 MCG/ACT AERS Take 2 puffs by mouth daily. 02/24/21  Yes [provider]  amiodarone (PACERONE) 200 MG tablet Take 1 tablet (200 mg total) by mouth 2 (two) times daily for 23 days. 04/05/21 12/10/21  Jennye Boroughs, MD  amiodarone (PACERONE) 400 MG tablet Take 1 tablet (400 mg total) by mouth 2 (two) times daily for 7 days. 03/29/21 04/05/21  Jennye Boroughs, MD  cetirizine (ZYRTEC) 10 MG tablet Take 10 mg by mouth daily. 02/24/21   [provider]  spironolactone (ALDACTONE) 25 MG tablet Take 25 mg by mouth daily. Patient not taking: Reported on 03/27/2021    [provider]  tamoxifen (NOLVADEX) 20 MG tablet Take 20 mg by mouth daily.    [provider]    Family History Family History  Problem Relation Age of Onset   Asthma Mother    Heart disease Mother    Kidney disease Mother    Pancreatic cancer Father     Social History Social History   Tobacco Use   Smoking status: Former    Types: Cigarettes    Quit date: 10/29/2009    Years since quitting: 12.8   Smokeless tobacco: Never  Vaping Use   Vaping Use: Never used  Substance Use Topics   Alcohol use: Not Currently   Drug use: Never     Allergies   Sulfa antibiotics, Ambien [zolpidem tartrate], Lyrica [pregabalin], and Tetanus toxoids   Review of Systems Review of Systems  Constitutional:   Positive for fatigue. Negative for chills, diaphoresis and fever.  HENT:  Positive for congestion. Negative for ear pain, rhinorrhea, sinus pressure, sinus pain and sore throat.   Respiratory:  Positive for cough and shortness of breath.   Cardiovascular:  Positive for chest pain.  Gastrointestinal:  Negative for abdominal pain, nausea and vomiting.  Musculoskeletal:  Negative for arthralgias and myalgias.  Skin:  Negative for rash.  Neurological:  Negative for weakness and headaches.  Hematological:  Negative for adenopathy.     Physical Exam Triage Vital Signs ED Triage Vitals  Enc Vitals Group     BP 09/12/22 1140 (!) 106/55     Pulse Rate 09/12/22 1140 76     Resp 09/12/22 1140 18     Temp 09/12/22 1140 98.3  F (36.8 C)     Temp Source 09/12/22 1140 Oral     SpO2 09/12/22 1140 95 %     Weight 09/12/22 1136 227 lb (103 kg)     Height 09/12/22 1136 '5\' 5"'$  (1.651 m)     Head Circumference --      Peak Flow --      Pain Score 09/12/22 1136 6     Pain Loc --      Pain Edu? --      Excl. in [place]? --    No data found.  Updated Vital Signs BP (!) 106/55 (BP Location: Left Arm)   Pulse 76   Temp 98.3 F (36.8 C) (Oral)   Resp 18   Ht '5\' 5"'$  (1.651 m)   Wt 227 lb (103 kg)   SpO2 95%   BMI 37.77 kg/m      Physical Exam Vitals and nursing note reviewed.  Constitutional:      General: She is not in acute distress.    Appearance: Normal appearance. She is ill-appearing. She is not toxic-appearing.  HENT:     Head: Normocephalic and atraumatic.     Nose: Nose normal.     Mouth/Throat:     Mouth: Mucous membranes are moist.     Pharynx: Oropharynx is clear.  Eyes:     General: No scleral icterus.       Right eye: No discharge.        Left eye: No discharge.     Conjunctiva/sclera: Conjunctivae normal.  Cardiovascular:     Rate and Rhythm: Normal rate and regular rhythm.     Heart sounds: Normal heart sounds.  Pulmonary:     Effort: Pulmonary effort is normal. No  respiratory distress.     Breath sounds: Rhonchi present.  Chest:     Chest wall: Tenderness: few scattered rhonchi throughout.  Musculoskeletal:     Cervical back: Neck supple.  Skin:    General: Skin is dry.  Neurological:     General: No focal deficit present.     Mental Status: She is alert. Mental status is at baseline.     Motor: No weakness.     Gait: Gait normal.  Psychiatric:        Mood and Affect: Mood normal.        Behavior: Behavior normal.        Thought Content: Thought content normal.      UC Treatments / Results  Labs (all labs ordered are listed, but only abnormal results are displayed) Labs Reviewed  RESP PANEL BY RT-PCR (FLU A&B, COVID) [id number]    EKG   Radiology No results found.  Procedures Procedures (including critical care time)  Medications Ordered in UC Medications - No data to display  Initial Impression / Assessment and Plan / UC Course  I have reviewed the triage vital signs and the nursing notes.  Pertinent labs & imaging results that were available during my care of the patient were reviewed by me and considered in my medical decision making (see chart for details).   71 year old female with history of COPD, CHF, A-fib and cardiomyopathy presents for 3 to 4-day history of cough and congestion.  Cough is productive of yellowish sputum.  She has not had any fevers.  She has been more short of breath than normal.  Has been using her inhalers at home and it does help but she is not feeling better.  She is afebrile.  She  is mildly ill-appearing but nontoxic.  She appears to be fatigued.  On exam she does have few scattered rhonchi throughout upper lung fields bilaterally.  No wheezing or respiratory distress.  Respiratory panel performed.  All negative.  I have advised patient I will contact her with results of positive.  She wanted to go home and rest.  Treating patient at this time for COPD exacerbation with Augmentin, benzonatate,  Promethazine DM, and prednisone.  Encouraged her to continue with her breathing treatments at home and increase rest and fluids.  Reviewed return or go to ER if she develops fever, worsening cough or shortness of breath.   Final Clinical Impressions(s) / UC Diagnoses   Final diagnoses:  COPD exacerbation (HCC)  Acute cough  Shortness of breath     Discharge Instructions      -We will call if the COVID or flu test are positive and send in antiviral medications if needed.  If positive for COVID need to isolate 5 days and wear a mask for 5 days.  If you do not hear from me those tests are negative and you likely have another viral illness which is flared up your COPD. - I sent cough medication to the pharmacy as well as prednisone and an antibiotic. - Continue with your inhalers at home. - If you develop a fever, worsening shortness of breath or feeling worse not better over the next 2 days you need to return or go to ER for reevaluation.     ED Prescriptions     Medication Sig Dispense Auth. Provider   amoxicillin-clavulanate (AUGMENTIN) 875-125 MG tablet Take 1 tablet by mouth every 12 (twelve) hours for 7 days. 14 tablet Laurene Footman B, PA-C   benzonatate (TESSALON) 200 MG capsule Take 1 capsule (200 mg total) by mouth 3 (three) times daily as needed for cough. 30 capsule Danton Clap, PA-C   promethazine-dextromethorphan (PROMETHAZINE-DM) 6.25-15 MG/5ML syrup Take 5 mLs by mouth 4 (four) times daily as needed. 118 mL Laurene Footman B, PA-C   predniSONE (DELTASONE) 20 MG tablet Take 2 tablets (40 mg total) by mouth daily for 5 days. 10 tablet Gretta Cool      PDMP not reviewed this encounter.   Danton Clap, PA-C 09/12/22 1239

## 2022-09-12 NOTE — Discharge Instructions (Signed)
-  We will call if the COVID or flu test are positive and send in antiviral medications if needed.  If positive for COVID need to isolate 5 days and wear a mask for 5 days.  If you do not hear from me those tests are negative and you likely have another viral illness which is flared up your COPD. - I sent cough medication to the pharmacy as well as prednisone and an antibiotic. - Continue with your inhalers at home. - If you develop a fever, worsening shortness of breath or feeling worse not better over the next 2 days you need to return or go to ER for reevaluation.

## 2023-03-02 IMAGING — CR DG CHEST 2V
3 series · 3 of 3 positions shown · non-contrast
Comparison: 03/27/2021

CLINICAL DATA: Cough, shortness of breath

EXAM:
CHEST - 2 VIEW

[chest ap (1 of 2)]
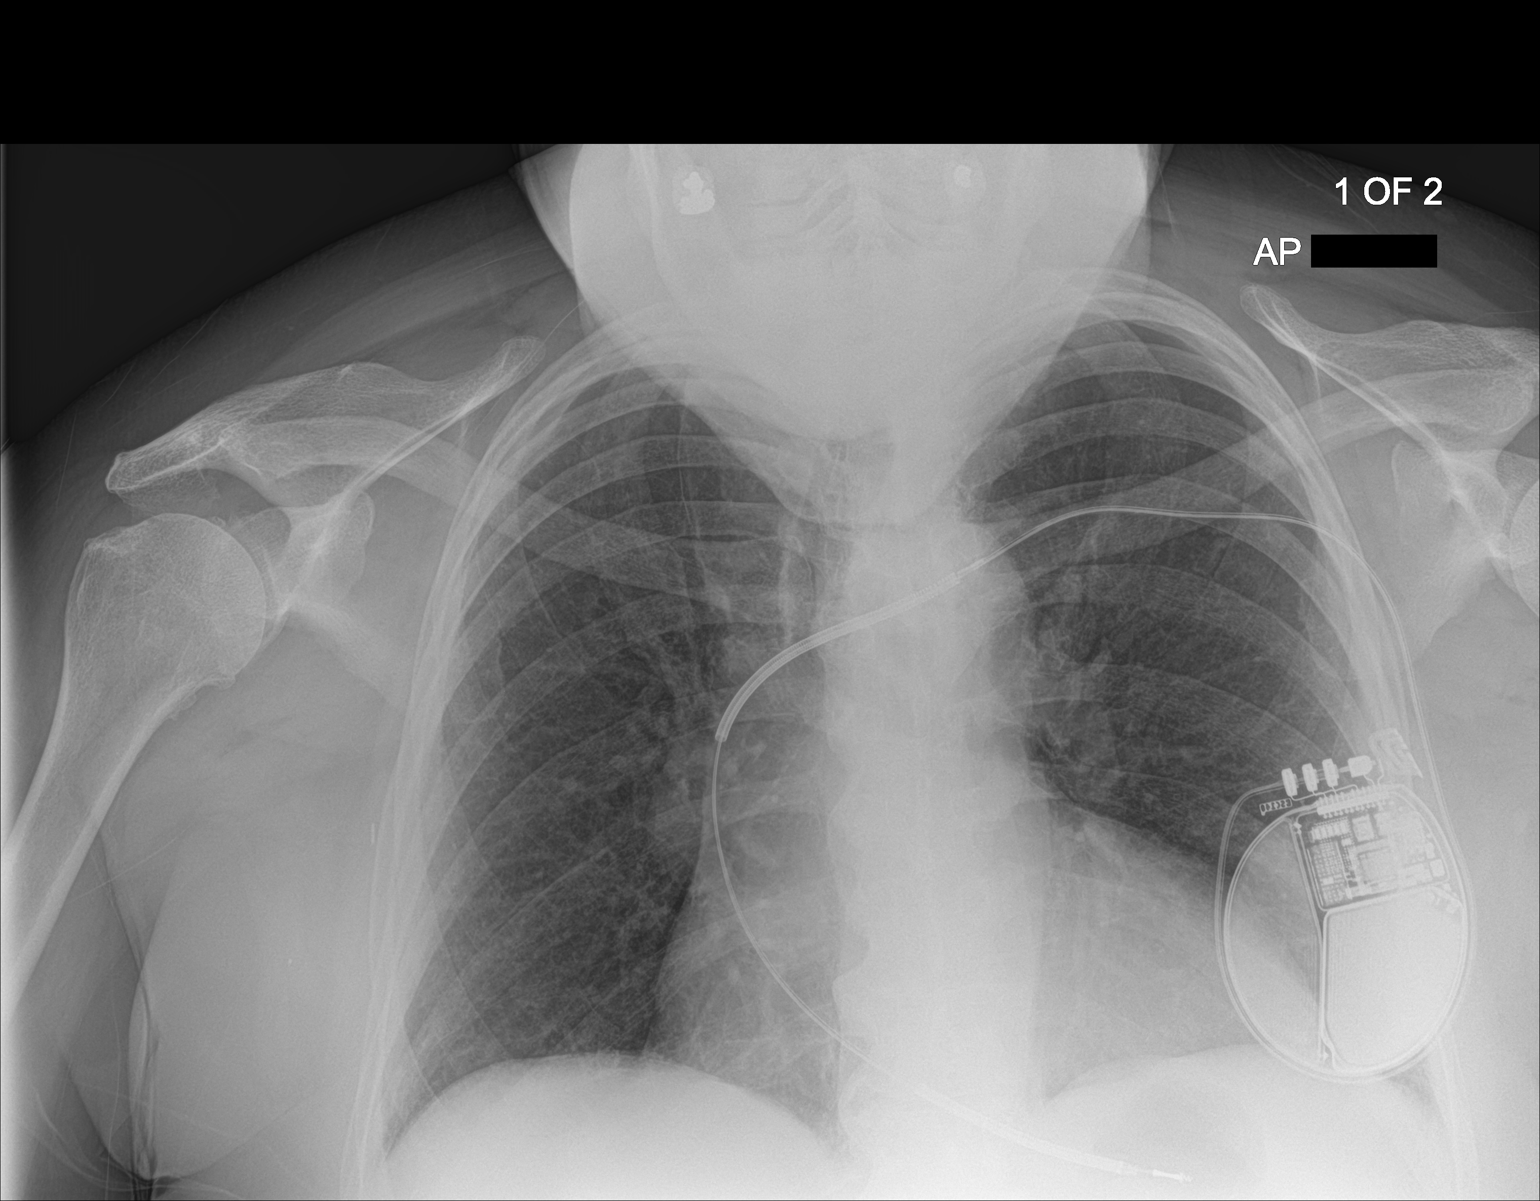

[chest lat]
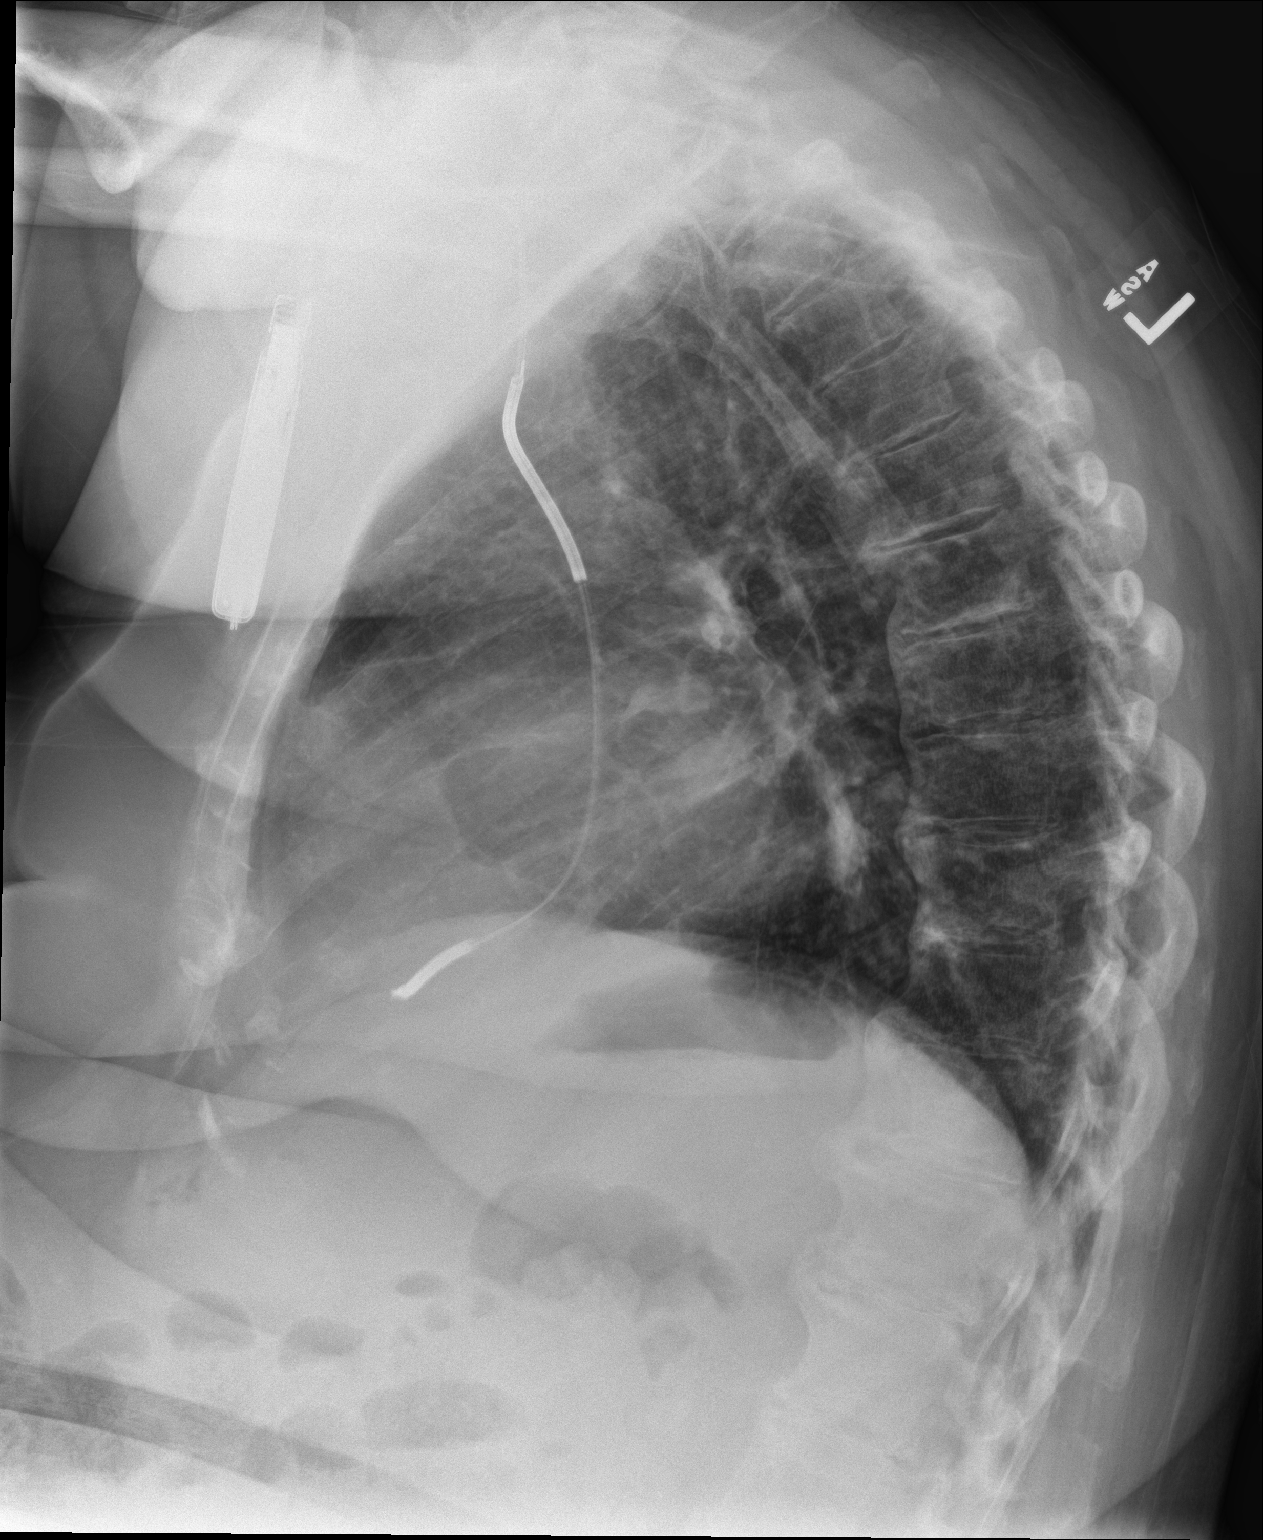

[chest ap (2 of 2)]
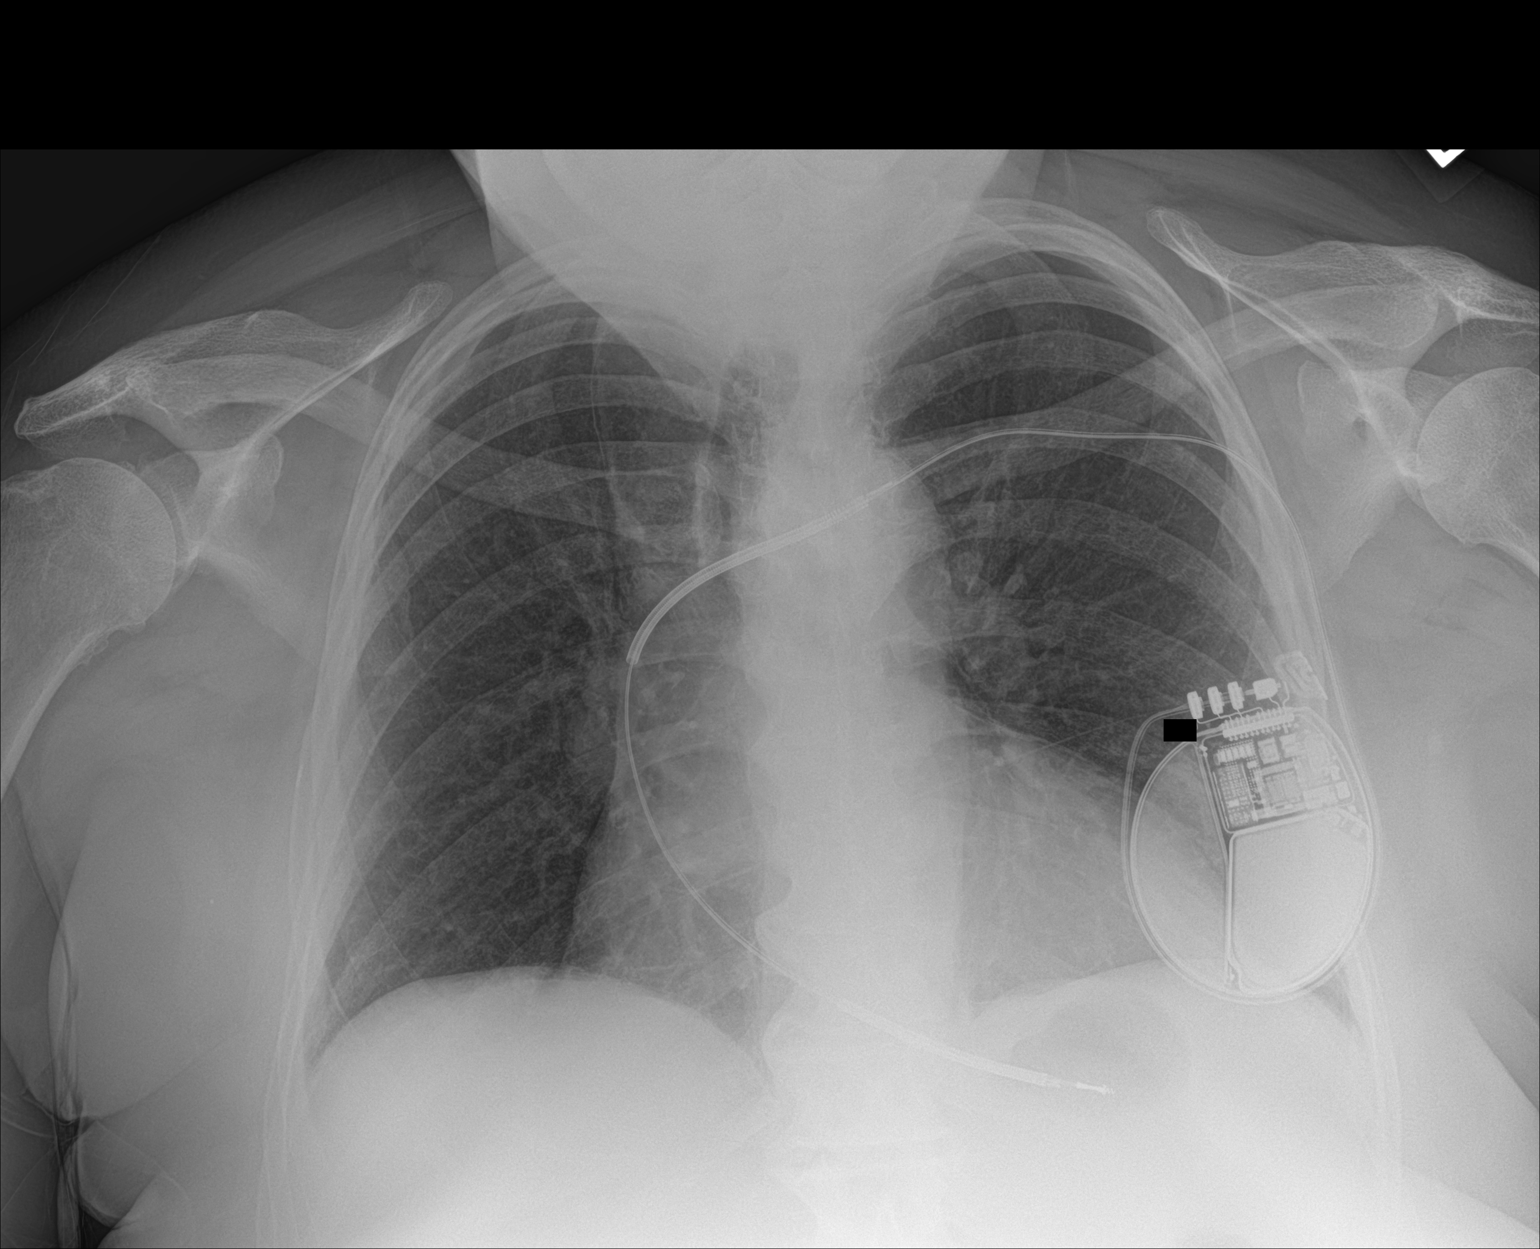

[3 of 3 positions shown; findings below may reference images not displayed]

FINDINGS: Stable positioning of a single lead left-sided implanted cardiac
device. Heart size is upper limits of normal, unchanged. No focal
airspace consolidation, pleural effusion, or pneumothorax.
Degenerative changes of the thoracic spine.
IMPRESSION: No active cardiopulmonary disease.

## 2024-02-13 DIAGNOSIS — E1129 Type 2 diabetes mellitus with other diabetic kidney complication: Secondary | ICD-10-CM | POA: Diagnosis present

## 2024-08-06 NOTE — Progress Notes (Signed)
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 tablets (0.3 mg total) by mouth every other day. 23 tablet 3  . conjugated estrogens (PREMARIN) 0.625 mg/gram vaginal cream INSERT 0.5 GRAM VAGINALLY 2 TIMES A WEEK (Patient not taking: Reported on 07/05/2024) 42.5 g 5  . diclofenac sodium (VOLTAREN) 1 % gel Apply 2 g topically four (4) times a day as needed for arthritis or pain.    SABRA MULTIVITAMIN ORAL Take 1 tablet by mouth daily. Senior Silver 60+    . polyethylene glycol (GLYCOLAX ) 17 gram/dose powder Mix 1 capful (17 grams) in 4-8 ounces of water,juice or tea and drink daily as needed for constipation (Patient not taking: Reported on 07/19/2024) 510 g 0  . senna (SENOKOT) 8.6 mg tablet Take 2 tablets by mouth nightly as needed for constipation. (Patient not taking: Reported on 07/19/2024) 60 tablet 0  . vit C-vit D3-E-zinc-elderberry (AIRBORNE VITS ZINC ELDERBERRY) 65 mg-3.15 mcg- 3.35 mg-1 mg Chew Chew 2 Units in the morning.     No current facility-administered medications for this visit.     ___________________________________ ALLERGIES: Allergies  Allergen Reactions  . Sulfa (Sulfonamide Antibiotics) Shortness Of Breath and Rash  . Ambien [Zolpidem] Other (See Comments)    Patient experienced amnesia and slurred speech in post op setting f/p hip replacement 03/04/15. Unclear if ambien played a role but would like to avoid if possible in future.  . Jardiance [Empagliflozin] Other (See Comments) and Confusion    UTIs  . Lyrica [Pregabalin] Other (See Comments)    nightmares   ----------------------------------------------------------- OBJECTIVE: Physical Exam: VITALS:  Vitals:   08/06/24 0859  BP: 89/58  Pulse: 72  Temp: 36.8 C (98.2 F)   Wt:  Wt Readings from Last 3 Encounters:  07/19/24 (!) 104.3 kg (230 lb)  07/11/24 (!) 104.4 kg (230 lb 2 oz)  07/05/24 (!) 105.8 kg (233 lb 3.2 oz)    GEN: NAD; Resting comfortably; Appears stated age;  HENT: Normocephalic; Atraumatic EYES: EOMI; Sclera Clear CV: RRR; No m/r/g LUNGS: CTAB; ABD: Soft; NTTP EXT: No edema MSK: MS 5/5 in UE and LE b/l;  R great toenail complete avulsed and barely attached at lateral bed.   NEURO: CN II-XII grossly intact; walking well.    LABS: Lab Results  Component Value Date   WBC 6.3 06/21/2024   HGB 10.7 (L) 06/21/2024   HCT 33.0 (L) 06/21/2024   PLT 274 06/21/2024    Lab Results  Component Value Date   NA 141 07/05/2024   K 4.3 07/05/2024   CL 101 07/05/2024   CO2 32.1 (H) 07/05/2024   BUN 12 07/05/2024   CREATININE 1.04 (H) 07/05/2024   GLU 93 07/05/2024   CALCIUM  9.8 07/05/2024   MG 1.8 07/05/2024   PHOS 3.9 05/28/2024    Lab Results  Component Value Date   BILITOT 0.5 07/05/2024   BILIDIR 0.20 07/05/2024   PROT 7.0 07/05/2024   ALBUMIN 3.2 (L) 07/05/2024   ALT 15 07/05/2024   AST 30 07/05/2024   ALKPHOS 103 07/05/2024   GGT 61 (H) 02/22/2012    Lab Results  Component Value Date   LABPROT 9.7 03/11/2014   INR 1.45 06/18/2024   APTT 29.7 06/18/2024

## 2024-08-10 NOTE — Home Health (Signed)
 OT visit completed.  Please see OT Interventions for treatment carried out today.  Will plan to see pt again next week. MGaray, OT/L

## 2024-08-12 ENCOUNTER — Emergency Department

## 2024-08-12 ENCOUNTER — Observation Stay
Admission: EM | Admit: 2024-08-12 | Discharge: 2024-08-13 | Disposition: A | Discharge status: Home / Self Care | Attending: Obstetrics and Gynecology | Admitting: Obstetrics and Gynecology

## 2024-08-12 ENCOUNTER — Encounter: Payer: Self-pay | Admitting: Emergency Medicine

## 2024-08-12 ENCOUNTER — Other Ambulatory Visit: Payer: Self-pay

## 2024-08-12 DIAGNOSIS — I130 Hypertensive heart and chronic kidney disease with heart failure and stage 1 through stage 4 chronic kidney disease, or unspecified chronic kidney disease: Secondary | ICD-10-CM | POA: Diagnosis not present

## 2024-08-12 DIAGNOSIS — F339 Major depressive disorder, recurrent, unspecified: Secondary | ICD-10-CM | POA: Diagnosis not present

## 2024-08-12 DIAGNOSIS — R531 Weakness: Secondary | ICD-10-CM | POA: Diagnosis present

## 2024-08-12 DIAGNOSIS — N1831 Chronic kidney disease, stage 3a: Secondary | ICD-10-CM | POA: Diagnosis present

## 2024-08-12 DIAGNOSIS — Z7901 Long term (current) use of anticoagulants: Secondary | ICD-10-CM | POA: Diagnosis not present

## 2024-08-12 DIAGNOSIS — D72829 Elevated white blood cell count, unspecified: Secondary | ICD-10-CM | POA: Insufficient documentation

## 2024-08-12 DIAGNOSIS — I5042 Chronic combined systolic (congestive) and diastolic (congestive) heart failure: Secondary | ICD-10-CM | POA: Diagnosis present

## 2024-08-12 DIAGNOSIS — E1129 Type 2 diabetes mellitus with other diabetic kidney complication: Secondary | ICD-10-CM | POA: Diagnosis present

## 2024-08-12 DIAGNOSIS — M1A9XX0 Chronic gout, unspecified, without tophus (tophi): Secondary | ICD-10-CM | POA: Insufficient documentation

## 2024-08-12 DIAGNOSIS — R42 Dizziness and giddiness: Secondary | ICD-10-CM

## 2024-08-12 DIAGNOSIS — J449 Chronic obstructive pulmonary disease, unspecified: Secondary | ICD-10-CM | POA: Diagnosis present

## 2024-08-12 DIAGNOSIS — Z853 Personal history of malignant neoplasm of breast: Secondary | ICD-10-CM | POA: Insufficient documentation

## 2024-08-12 DIAGNOSIS — Z95810 Presence of automatic (implantable) cardiac defibrillator: Secondary | ICD-10-CM | POA: Diagnosis not present

## 2024-08-12 DIAGNOSIS — I480 Paroxysmal atrial fibrillation: Secondary | ICD-10-CM | POA: Diagnosis present

## 2024-08-12 DIAGNOSIS — Z87891 Personal history of nicotine dependence: Secondary | ICD-10-CM | POA: Diagnosis not present

## 2024-08-12 DIAGNOSIS — G4733 Obstructive sleep apnea (adult) (pediatric): Secondary | ICD-10-CM | POA: Diagnosis present

## 2024-08-12 DIAGNOSIS — W1811XA Fall from or off toilet without subsequent striking against object, initial encounter: Secondary | ICD-10-CM | POA: Insufficient documentation

## 2024-08-12 DIAGNOSIS — E669 Obesity, unspecified: Secondary | ICD-10-CM | POA: Diagnosis present

## 2024-08-12 DIAGNOSIS — R936 Abnormal findings on diagnostic imaging of limbs: Secondary | ICD-10-CM

## 2024-08-12 DIAGNOSIS — I420 Dilated cardiomyopathy: Secondary | ICD-10-CM | POA: Diagnosis not present

## 2024-08-12 DIAGNOSIS — S0083XA Contusion of other part of head, initial encounter: Principal | ICD-10-CM | POA: Insufficient documentation

## 2024-08-12 DIAGNOSIS — E1122 Type 2 diabetes mellitus with diabetic chronic kidney disease: Secondary | ICD-10-CM | POA: Insufficient documentation

## 2024-08-12 DIAGNOSIS — E039 Hypothyroidism, unspecified: Secondary | ICD-10-CM | POA: Diagnosis not present

## 2024-08-12 DIAGNOSIS — C50919 Malignant neoplasm of unspecified site of unspecified female breast: Secondary | ICD-10-CM | POA: Diagnosis present

## 2024-08-12 DIAGNOSIS — Z79899 Other long term (current) drug therapy: Secondary | ICD-10-CM | POA: Diagnosis not present

## 2024-08-12 DIAGNOSIS — R296 Repeated falls: Principal | ICD-10-CM

## 2024-08-12 DIAGNOSIS — I10 Essential (primary) hypertension: Secondary | ICD-10-CM | POA: Diagnosis present

## 2024-08-12 LAB — CBC WITH DIFFERENTIAL/PLATELET
Abs Immature Granulocytes: 0.1 K/uL — ABNORMAL HIGH (ref 0.00–0.07)
Basophils Absolute: 0.1 K/uL (ref 0.0–0.1)
Basophils Relative: 0 %
Eosinophils Absolute: 0.1 K/uL (ref 0.0–0.5)
Eosinophils Relative: 0 %
HCT: 39.4 % (ref 36.0–46.0)
Hemoglobin: 12.3 g/dL (ref 12.0–15.0)
Immature Granulocytes: 1 %
Lymphocytes Relative: 20 %
Lymphs Abs: 3.4 K/uL (ref 0.7–4.0)
MCH: 30.4 pg (ref 26.0–34.0)
MCHC: 31.2 g/dL (ref 30.0–36.0)
MCV: 97.5 fL (ref 80.0–100.0)
Monocytes Absolute: 1.4 K/uL — ABNORMAL HIGH (ref 0.1–1.0)
Monocytes Relative: 8 %
Neutro Abs: 12.1 K/uL — ABNORMAL HIGH (ref 1.7–7.7)
Neutrophils Relative %: 71 %
Platelets: 426 K/uL — ABNORMAL HIGH (ref 150–400)
RBC: 4.04 MIL/uL (ref 3.87–5.11)
RDW: 14.8 % (ref 11.5–15.5)
WBC: 17 K/uL — ABNORMAL HIGH (ref 4.0–10.5)
nRBC: 0 % (ref 0.0–0.2)

## 2024-08-12 LAB — COMPREHENSIVE METABOLIC PANEL WITH GFR
ALT: 14 U/L (ref 0–44)
AST: 35 U/L (ref 15–41)
Albumin: 2.1 g/dL — ABNORMAL LOW (ref 3.5–5.0)
Alkaline Phosphatase: 71 U/L (ref 38–126)
Anion gap: 10 (ref 5–15)
BUN: 10 mg/dL (ref 8–23)
CO2: 28 mmol/L (ref 22–32)
Calcium: 7.9 mg/dL — ABNORMAL LOW (ref 8.9–10.3)
Chloride: 98 mmol/L (ref 98–111)
Creatinine, Ser: 1.09 mg/dL — ABNORMAL HIGH (ref 0.44–1.00)
GFR, Estimated: 53 mL/min — ABNORMAL LOW (ref >60)
Glucose, Bld: 77 mg/dL (ref 70–99)
Potassium: 3.3 mmol/L — ABNORMAL LOW (ref 3.5–5.1)
Sodium: 136 mmol/L (ref 135–145)
Total Bilirubin: 0.8 mg/dL (ref 0.0–1.2)
Total Protein: 5.6 g/dL — ABNORMAL LOW (ref 6.5–8.1)

## 2024-08-12 LAB — PROCALCITONIN: Procalcitonin: <0.1 ng/mL

## 2024-08-12 LAB — RESP PANEL BY RT-PCR (RSV, FLU A&B, COVID)  RVPGX2
Influenza A by PCR: NEGATIVE
Influenza B by PCR: NEGATIVE
Resp Syncytial Virus by PCR: NEGATIVE
SARS Coronavirus 2 by RT PCR: NEGATIVE

## 2024-08-12 LAB — URINALYSIS, COMPLETE (UACMP) WITH MICROSCOPIC
Bilirubin Urine: NEGATIVE
Glucose, UA: NEGATIVE mg/dL
Ketones, ur: NEGATIVE mg/dL
Leukocytes,Ua: NEGATIVE
Nitrite: NEGATIVE
Protein, ur: NEGATIVE mg/dL
Specific Gravity, Urine: 1.011 (ref 1.005–1.030)
pH: 6 (ref 5.0–8.0)

## 2024-08-12 LAB — LIPASE, BLOOD: Lipase: 19 U/L (ref 11–51)

## 2024-08-12 LAB — PROTIME-INR
INR: 1.4 — ABNORMAL HIGH (ref 0.8–1.2)
Prothrombin Time: 18.2 s — ABNORMAL HIGH (ref 11.4–15.2)

## 2024-08-12 LAB — MAGNESIUM: Magnesium: 1.8 mg/dL (ref 1.7–2.4)

## 2024-08-12 MED ORDER — HYDROMORPHONE HCL 1 MG/ML IJ SOLN: 0.5000 mg | INTRAMUSCULAR | Status: AC | PRN

## 2024-08-12 MED ORDER — APIXABAN 5 MG PO TABS
5.0000 mg | ORAL_TABLET | Freq: Two times a day (BID) | ORAL | Status: DC
  Administered 2024-08-12 – 2024-08-13 (×3): 5 mg via ORAL
  Filled 2024-08-12 (×3): qty 1

## 2024-08-12 MED ORDER — AMIODARONE HCL 200 MG PO TABS
200.0000 mg | ORAL_TABLET | Freq: Every day | ORAL | Status: DC
  Administered 2024-08-12 – 2024-08-13 (×2): 200 mg via ORAL
  Filled 2024-08-12 (×2): qty 1

## 2024-08-12 MED ORDER — ONDANSETRON HCL 4 MG/2ML IJ SOLN: 4.0000 mg | Freq: Three times a day (TID) | INTRAMUSCULAR | Status: AC | PRN

## 2024-08-12 MED ORDER — GABAPENTIN 300 MG PO CAPS
400.0000 mg | ORAL_CAPSULE | Freq: Two times a day (BID) | ORAL | Status: DC
  Administered 2024-08-12 – 2024-08-13 (×3): 400 mg via ORAL
  Filled 2024-08-12 (×3): qty 1

## 2024-08-12 MED ORDER — MECLIZINE HCL 25 MG PO TABS
25.0000 mg | ORAL_TABLET | Freq: Once | ORAL | Status: AC
  Administered 2024-08-12: 25 mg via ORAL
  Filled 2024-08-12: qty 1

## 2024-08-12 MED ORDER — FUROSEMIDE 20 MG PO TABS
20.0000 mg | ORAL_TABLET | Freq: Every day | ORAL | Status: DC
  Administered 2024-08-12 – 2024-08-13 (×2): 20 mg via ORAL
  Filled 2024-08-12 (×2): qty 1

## 2024-08-12 MED ORDER — MAGNESIUM OXIDE 400 MG PO TABS
400.0000 mg | ORAL_TABLET | Freq: Every day | ORAL | Status: DC
Start: 2024-08-13 — End: 2024-08-13
  Administered 2024-08-13: 400 mg via ORAL
  Filled 2024-08-12 (×2): qty 1

## 2024-08-12 MED ORDER — UMECLIDINIUM-VILANTEROL 62.5-25 MCG/ACT IN AEPB
1.0000 | INHALATION_SPRAY | Freq: Every day | RESPIRATORY_TRACT | Status: DC
  Administered 2024-08-12 – 2024-08-13 (×2): 1 via RESPIRATORY_TRACT
  Filled 2024-08-12: qty 14

## 2024-08-12 MED ORDER — ALLOPURINOL 100 MG PO TABS
100.0000 mg | ORAL_TABLET | Freq: Two times a day (BID) | ORAL | Status: DC
  Administered 2024-08-12 – 2024-08-13 (×2): 100 mg via ORAL
  Filled 2024-08-12 (×2): qty 1

## 2024-08-12 MED ORDER — DULOXETINE HCL 30 MG PO CPEP
60.0000 mg | ORAL_CAPSULE | Freq: Every day | ORAL | Status: DC
  Administered 2024-08-12 – 2024-08-13 (×2): 60 mg via ORAL
  Filled 2024-08-12 (×2): qty 2

## 2024-08-12 MED ORDER — POTASSIUM CHLORIDE 20 MEQ PO PACK
40.0000 meq | PACK | Freq: Once | ORAL | Status: AC
  Administered 2024-08-12: 40 meq via ORAL
  Filled 2024-08-12: qty 2

## 2024-08-12 MED ORDER — ATORVASTATIN CALCIUM 20 MG PO TABS
40.0000 mg | ORAL_TABLET | Freq: Every day | ORAL | Status: DC
Start: 2024-08-12 — End: 2024-08-13
  Administered 2024-08-12 – 2024-08-13 (×2): 40 mg via ORAL
  Filled 2024-08-12 (×2): qty 2

## 2024-08-12 MED ORDER — ALBUTEROL SULFATE (2.5 MG/3ML) 0.083% IN NEBU: 2.5000 mg | INHALATION_SOLUTION | RESPIRATORY_TRACT | Status: DC | PRN

## 2024-08-12 MED ORDER — IPRATROPIUM-ALBUTEROL 0.5-2.5 (3) MG/3ML IN SOLN: 3.0000 mL | Freq: Four times a day (QID) | RESPIRATORY_TRACT | Status: DC | PRN

## 2024-08-12 MED ORDER — SODIUM CHLORIDE 0.9 % IV BOLUS
500.0000 mL | Freq: Once | INTRAVENOUS | Status: AC
  Administered 2024-08-12: 500 mL via INTRAVENOUS

## 2024-08-12 MED ORDER — LEVOTHYROXINE SODIUM 100 MCG PO TABS
100.0000 ug | ORAL_TABLET | Freq: Every day | ORAL | Status: DC
  Administered 2024-08-13: 100 ug via ORAL
  Filled 2024-08-12: qty 1

## 2024-08-12 NOTE — ED Notes (Signed)
 Pt ambulated to BR with walker. Pt states that she uses a walker at home.

## 2024-08-12 NOTE — H&P (Addendum)
 History and Physical    Patient: Sophia Andrews FMW:[id number] DOB: [birth year] DOA: 08/12/2024 DOS: the patient was seen and examined on 08/12/2024 PCP: Barzin, Amir Homayoun, DO  Patient coming from: Home  Chief Complaint:  Chief Complaint  Patient presents with   Fall   HPI: Sophia Andrews is a 73 y.o. female with medical history significant of Chronic HFrEF, nonischemic cardiomyopathy, HTN, paroxysmal A-fib on Eliquis , CKD, COPD, chronic gout, recurrent MDD, OSA presented due to falls at home.  Patient reports walking to the bathroom, had a fall and hit her head, denies prodromal symptoms, LOC, nausea/vomiting.  Reports having 2 episodes in the last 48 hours.  Also reports her legs feel heavier than usual and dizziness.  No falls prior to this episode, ambulates with walker Denies fever, chills, cough, SOB, chest pain, dysuria, abd pain.  2 episodes of BM today.  Reports recent history of recurrent UTIs, was started on unspecified antibiotic for UTI prevention about 3 days ago  On presentation to ED, found to be afebrile, HR 70s, BP 117/45, saturating 95% on room air. Workup revealed CMP with potassium 3.3, creatinine 1.09, calcium  7.9, hypoalbuminemia 2.1.  CBC with leukocytosis 17.0 with left shift, thrombocytosis with platelet count 426.  PT/INR 18.2/1.4.  COVID/flu/RSV negative.  UA equivocal CT head no acute abnormality.  CT C-spine multilevel degenerative change without acute abnormality.  CXR negative.  X-ray pelvis with lucencies in proximal left femur (suspicious for metastatic lesions), no acute fracture.  EKG with sinus rhythm, rate 72, no gross ST elevation or depression, left axis deviation, no significant repolarization abnormality, leftward axis. Mildly prolonged QTc at 506  Was treated with NS bolus 500 cc, potassium 40 mEq, meclizine  25 mg   Review of Systems: As mentioned in the history of present illness. All other systems reviewed and are negative. Past Medical  History:  Diagnosis Date   Cancer [facility])    Cardiac defibrillator in place    CHF (congestive heart failure) (HCC)    COPD (chronic obstructive pulmonary disease) (HCC)    Hypertension    OSA (obstructive sleep apnea)    Pacemaker    Past Surgical History:  Procedure Laterality Date   ABDOMINAL HYSTERECTOMY     JOINT REPLACEMENT     TONSILLECTOMY     Social History:  reports that she quit smoking about 14 years ago. She has never used smokeless tobacco. She reports that she does not currently use alcohol. She reports that she does not use drugs.  Allergies  Allergen Reactions   Sulfa Antibiotics Rash and Shortness Of Breath   Ambien [Zolpidem Tartrate]    Empagliflozin Other (See Comments)    Confusion and UTIs  UTIs   Lyrica [Pregabalin]    Tetanus Toxoid-Containing Vaccines     Family History  Problem Relation Age of Onset   Asthma Mother    Heart disease Mother    Kidney disease Mother    Pancreatic cancer Father     Prior to Admission medications   Medication Sig Start Date End Date Taking? Authorizing Provider  allopurinol  (ZYLOPRIM ) 300 MG tablet Take 300 mg by mouth daily.    Yes [provider]  amiodarone  (PACERONE ) 200 MG tablet Take 1 tablet (200 mg total) by mouth 2 (two) times daily for 23 days. 04/05/21 08/12/24 Yes Jens Durand, MD  amoxicillin -clavulanate (AUGMENTIN ) 875-125 MG tablet Take 1 tablet by mouth 2 (two) times daily. 06/16/24  Yes [provider]  atorvastatin  (LIPITOR) 40 MG tablet  Take 40 mg by mouth daily.   Yes [provider]  busPIRone (BUSPAR) 10 MG tablet Take 10 mg by mouth 2 (two) times daily. 04/09/24  Yes [provider]  cephALEXin (KEFLEX) 250 MG capsule Take 250 mg by mouth daily. 08/07/24 11/05/24 Yes [provider]  colchicine 0.6 MG tablet Take 0.3 mg by mouth every other day. 12/25/21 06/14/25 Yes [provider]  guaiFENesin (MUCINEX) 600 MG 12 hr tablet Take 600 mg by mouth 2  (two) times daily as needed for cough. 06/15/24  Yes [provider]  ipratropium-albuterol  (DUONEB) 0.5-2.5 (3) MG/3ML SOLN Inhale 3 mLs into the lungs every 6 (six) hours as needed. 05/28/24  Yes [provider]  metoprolol  succinate (TOPROL -XL) 50 MG 24 hr tablet Take 50 mg by mouth daily.    Yes [provider]  predniSONE  (DELTASONE ) 10 MG tablet Take 10 mg by mouth 2 (two) times daily. 07/10/24  Yes [provider]  PREMARIN vaginal cream Place 1 applicator vaginally 2 (two) times a week. 10/12/21  Yes [provider]  sacubitril -valsartan  (ENTRESTO ) 24-26 MG Take 1 tablet by mouth 2 (two) times daily. 08/13/22  Yes [provider]  traZODone  (DESYREL ) 50 MG tablet Take 50 mg by mouth at bedtime. 04/09/24  Yes [provider]  umeclidinium-vilanterol (ANORO ELLIPTA ) 62.5-25 MCG/ACT AEPB Inhale 1 puff into the lungs daily. 05/21/24  Yes [provider]  WEGOVY 0.25 MG/0.5ML SOAJ SQ injection Inject 0.25 mg into the skin once a week. 02/22/24  Yes [provider]  acetaminophen  (TYLENOL ) 650 MG CR tablet Take 1,300 mg by mouth 3 (three) times daily. 04/25/20   [provider]  albuterol  (VENTOLIN  HFA) 108 (90 Base) MCG/ACT inhaler Inhale 2 puffs into the lungs every 6 (six) hours as needed for shortness of breath or wheezing. 11/26/19   LampteyAleene KIDD, MD  Albuterol  Sulfate, sensor, 108 (90 Base) MCG/ACT AEPB Inhale 1 Inhalation into the lungs every 6 (six) hours as needed (wheezing).    [provider]  amiodarone  (PACERONE ) 400 MG tablet Take 1 tablet (400 mg total) by mouth 2 (two) times daily for 7 days. 03/29/21 04/05/21  Jens Durand, MD  benzonatate  (TESSALON ) 200 MG capsule Take 1 capsule (200 mg total) by mouth 3 (three) times daily as needed for cough. 09/12/22   Arvis Jolan NOVAK, PA-C  cetirizine (ZYRTEC) 10 MG tablet Take 10 mg by mouth daily. 02/24/21   [provider]  DULoxetine   (CYMBALTA ) 60 MG capsule Take 120 mg by mouth daily.     [provider]  ELIQUIS  5 MG TABS tablet Take 1 tablet by mouth 2 (two) times daily. 03/20/21   [provider]  furosemide  (LASIX ) 20 MG tablet Take 20 mg by mouth.     [provider]  gabapentin  (NEURONTIN ) 400 MG capsule Take 800 mg by mouth 3 (three) times daily.     [provider]  HYDROcodone -acetaminophen  (NORCO/VICODIN) 5-325 MG tablet Take 1 tablet by mouth 2 (two) times daily as needed for pain. 06/22/19   [provider]  magnesium  oxide (MAG-OX) 400 MG tablet Take 400 mg by mouth daily.    [provider]  Multiple Vitamins-Minerals (ONE-A-DAY WOMENS 50 PLUS) TABS Take 1 tablet by mouth daily.    [provider]  naphazoline-glycerin (CLEAR EYES) 0.012-0.2 % SOLN 1-2 drops 4 (four) times daily as needed for eye irritation.    [provider]  nitrofurantoin (MACRODANTIN) 100 MG capsule Take  100 mg by mouth 2 (two) times daily.    [provider]  Probiotic, Lactobacillus, CAPS Take 1 capsule by mouth every morning.    [provider]  promethazine -dextromethorphan (PROMETHAZINE -DM) 6.25-15 MG/5ML syrup Take 5 mLs by mouth 4 (four) times daily as needed. 09/12/22   Arvis Jolan NOVAK, PA-C  spironolactone  (ALDACTONE ) 25 MG tablet Take 25 mg by mouth daily. Patient not taking: Reported on 03/27/2021    [provider]  STIOLTO RESPIMAT 2.5-2.5 MCG/ACT AERS Take 2 puffs by mouth daily. 02/24/21   [provider]    Physical Exam: Vitals:   08/12/24 0615 08/12/24 0624 08/12/24 0641 08/12/24 0823  BP:   (!) 114/55 (!) 101/47  Pulse: 73  75 70  Resp: 17   18  Temp:  98.4 F (36.9 C) 98.1 F (36.7 C) 97.7 F (36.5 C)  TempSrc:    Oral  SpO2: 93%  93% 96%  Weight:      Height:        General: Awake, no distress.  HEENT:  Abrasion to forehead CV:  Good peripheral perfusion. RRR, RP 2+ Resp:    Normal effort. CTAB Abd:   No distention. Nontender to deep palpation throughout Neuro:   AO x 4, cranial nerves intact, asymmetric, no focal motor deficit appreciated, does have difficulty lifting bilateral legs off stretcher but is able to do so.  EHL/FHL 5/5 bilaterally  Data Reviewed: Labs and imaging reviewed  Assessment and Plan:  Dizziness Recurrent falls - Patient reports having 2 episodes of fall in the last 48 hours and dizziness, denies prodromal symptoms, LOC.  No prior history of falls, ambulates with walker.  States was recently started on cephalexin for UTI prophylaxis - Unclear etiology, reports poor p.o. intake -suspect due to dehydration vs stroke vs arrhythmia - s/p IV fluids in ER, Orthostatic vitals - MRI brain pending - Telemetry monitoring, benefit from pacemaker interrogation if symptoms do not improve - PT/OT eval  Leukocytosis - Unclear etiology - No evidence of infection, COVID/flu/RSV negative.  UA equivocal, CXR neg - Hold off antibiotics, check procalcitonin - RVP pending - Follow-up urine culture and blood cultures  Hypokalemia - Check magnesium  - Monitor and replete as needed  Proximal left femur lucency - XR pelvis lucencies in proximal left femur (suspicious for metastatic lesions) - Hx breast cancer in remission - MRI Left femur to characterize the lesion   Chronic HFrEF Nonischemic cardiomyopathy - Hold Entresto  24/26 bid, metoprolol , lasix  due to borderline BP - Not on SGLT2's due to recurrent UTI's  HTN - On metoprolol , Entresto , Lasix  - held  Paroxysmal A-fib - Continue amiodarone , Eliquis   CKD stage IIIa - Monitor creatinine  COPD - Not in acute exacerbation - Home inhalers, follows with pulmonary outpatient  Chronic gout - On allopurinol   Recurrent major depressive disorder - Continue Cymbalta   OSA - CPAP at night     Advance Care Planning:   Code Status: Limited: Do not attempt resuscitation (DNR) -DNR-LIMITED -Do Not Intubate/DNI discussed  with patient at the bedside  Consults: None  Family Communication: Discussed with patient and tried calling daughter Randine -left voicemail  Severity of Illness: The appropriate patient status for this patient is OBSERVATION. Observation status is judged to be reasonable and necessary in order to provide the required intensity of service to ensure the patient's safety. The patient's presenting symptoms, physical exam findings, and initial radiographic and laboratory data in the context of their medical condition is felt to place them  at decreased risk for further clinical deterioration. Furthermore, it is anticipated that the patient will be medically stable for discharge from the hospital within 2 midnights of admission.   Author: Laree Lock, MD 08/12/2024 2:29 PM  For on call review [email or web address].

## 2024-08-12 NOTE — Evaluation (Signed)
 Physical Therapy Evaluation Patient Details Name: Sophia Andrews MRN: [id number] DOB: [birth year] Today's Date: 08/12/2024  History of Present Illness  Sophia Andrews is a 73 y.o. female with medical history significant of Chronic HFrEF, nonischemic cardiomyopathy, HTN, paroxysmal A-fib on Eliquis , CKD, COPD, chronic gout, recurrent MDD, OSA presented due to falls at home.  Clinical Impression  Patient seen for initial PT evaluation due to decline in function . Patient is A&O x 4. Baseline mobility reported as modI , currently requiring modA for transfers and standing at bedside. Gait deferred due to weakness and symptoms of nausea and dizziness. Vitals monitored during session but were stable. Pt lives alone and assistance is provided from family when needed. Pt states that she is currently in HHPT for balance and falls. Pt family in room and is agreeable that she is not yet at baseline. PT discussed possible recommendations to placement in skilled care facility due to apparent pt. Weakness and poor activity tolerance. Recommend skilled PT to address safety, mobility, and discharge planning.          If plan is discharge home, recommend the following: A lot of help with walking and/or transfers;A lot of help with bathing/dressing/bathroom   Can travel by private vehicle   Yes    Equipment Recommendations    Recommendations for Other Services       Functional Status Assessment Patient has had a recent decline in their functional status and demonstrates the ability to make significant improvements in function in a reasonable and predictable amount of time.     Precautions / Restrictions Precautions Precautions: None      Mobility  Bed Mobility Overal bed mobility: Needs Assistance Bed Mobility: Rolling, Sidelying to Sit, Supine to Sit, Sit to Supine Rolling: Mod assist Sidelying to sit: Mod assist Supine to sit: Mod assist Sit to supine: Mod assist, +2 for physical assistance    General bed mobility comments: +2 from daugther in room for bed positioning    Transfers Overall transfer level: Needs assistance Equipment used: Rolling walker (2 wheels) Transfers: Sit to/from Stand, Bed to chair/wheelchair/BSC Sit to Stand: Mod assist Stand pivot transfers: Mod assist, +2 physical assistance         General transfer comment: x2 from daughter for assistance to stablize chair; vitals monitored pre and post tranfers; At rest: 112/47 70 bpm 93% spO2 Return to bed: 114/70 77 bpm 95% spO2; symptoms monitored as well. Pt reported nausea during transfer to [facility] which resolved when returned to bed prior to ending session    Ambulation/Gait               General Gait Details: gait deferred due to weakness demonstrated during transfers  Stairs            Wheelchair Mobility     Tilt Bed    Modified Rankin (Stroke Patients Only)       Balance Overall balance assessment: Needs assistance Sitting-balance support: Bilateral upper extremity supported Sitting balance-Leahy Scale: Poor     Standing balance support: Bilateral upper extremity supported Standing balance-Leahy Scale: Poor                               Pertinent Vitals/Pain Pain Assessment Pain Assessment: No/denies pain    Home Living Family/patient expects to be discharged to:: Private residence Living Arrangements: Alone Available Help at Discharge: Family;Available PRN/intermittently Type of Home: Apartment Home Access: Level entry  Home Layout: One level Home Equipment: Shower seat      Prior Function Prior Level of Function : Independent/Modified Independent             Mobility Comments: use RW due to balance; subjectively reported that she was afraid of community ambulation at baseline       Extremity/Trunk Assessment        Lower Extremity Assessment Lower Extremity Assessment: Generalized weakness       Communication         Cognition Arousal: Alert Behavior During Therapy: WFL for tasks assessed/performed   PT - Cognitive impairments: No apparent impairments                         Following commands: Intact       Cueing       General Comments      Exercises Other Exercises Other Exercises: pt and pt. family educated on function, expected progression of symptoms and nmobility exercises in bed   Assessment/Plan    PT Assessment Patient needs continued PT services  PT Problem List Decreased strength;Decreased range of motion;Decreased activity tolerance;Decreased balance;Decreased mobility;Decreased safety awareness;Decreased knowledge of precautions       PT Treatment Interventions DME instruction;Gait training;Functional mobility training;Therapeutic activities;Therapeutic exercise;Balance training;Neuromuscular re-education;Patient/family education    PT Goals (Current goals can be found in the Care Plan section)  Acute Rehab PT Goals Patient Stated Goal: Pt wants to return home PT Goal Formulation: With patient/family Time For Goal Achievement: 08/26/24 Potential to Achieve Goals: Fair    Frequency Min 3X/week     Co-evaluation               AM-PAC PT 6 Clicks Mobility  Outcome Measure Help needed turning from your back to your side while in a flat bed without using bedrails?: None Help needed moving from lying on your back to sitting on the side of a flat bed without using bedrails?: A Lot Help needed moving to and from a bed to a chair (including a wheelchair)?: A Lot Help needed standing up from a chair using your arms (e.g., wheelchair or bedside chair)?: A Lot Help needed to walk in hospital room?: A Lot Help needed climbing 3-5 steps with a railing? : Total 6 Click Score: 13    End of Session Equipment Utilized During Treatment: Gait belt Activity Tolerance: Patient limited by fatigue;Patient limited by lethargy Patient left: in bed;with family/visitor  present;with bed alarm set Nurse Communication: Mobility status PT Visit Diagnosis: Unsteadiness on feet (R26.81);Other abnormalities of gait and mobility (R26.89);Repeated falls (R29.6);Muscle weakness (generalized) (M62.81);History of falling (Z91.81);Difficulty in walking, not elsewhere classified (R26.2)    Time: 8594-8561 PT Time Calculation (min) (ACUTE ONLY): 33 min   Charges:   PT Evaluation $PT Eval Moderate Complexity: 1 Mod   PT General Charges $$ ACUTE PT VISIT: 1 Visit        Sherlean Lesches DPT, PT    Dessie Tatem A Signe Tackitt 08/12/2024, 3:11 PM

## 2024-08-12 NOTE — Care Management Obs Status (Signed)
 MEDICARE OBSERVATION STATUS NOTIFICATION   Patient Details  Name: SUZY KUGEL MRN: [id number] Date of Birth: 20-May-1951   Medicare Observation Status Notification Given:  Yes    Victory Jackquline RAMAN, RN 08/12/2024, 10:36 AM

## 2024-08-12 NOTE — ED Provider Notes (Signed)
 [facility] Provider Note    Event Date/Time   First MD Initiated Contact with Patient 08/12/24 [phone]     (approximate)   History   Fall  No notes on file   HPI Sophia Andrews is a 73 y.o. female PMH dilated cardiomyopathy with defibrillator, COPD, hypertension, CHF, A-fib on Eliquis  presents for evaluation after a fall - Patient states she was walking to the bathroom this evening but fell forward hitting her head.  No loss of consciousness.  Confirms she is taking her anticoagulation.  No subsequent vomiting. -Does note that her legs have been feeling heavier than usual the past couple days.  Also fell yesterday in her kitchen.  Has not had any recent falls prior to that.  Ambulates with a walker. -No recent infectious symptoms -Lives alone -Notes history of recurrent UTIs, says she started an unspecified antibiotic for UTI prevention about 3 days ago     Physical Exam   Triage Vital Signs: ED Triage Vitals  Encounter Vitals Group     BP 08/12/24 0218 (!) 109/53     Girls Systolic BP Percentile --      Girls Diastolic BP Percentile --      Boys Systolic BP Percentile --      Boys Diastolic BP Percentile --      Pulse Rate 08/12/24 0218 73     Resp 08/12/24 0218 17     Temp 08/12/24 0218 98.3 F (36.8 C)     Temp Source 08/12/24 0218 Oral     SpO2 08/12/24 0218 96 %     Weight 08/12/24 0216 230 lb (104.3 kg)     Height 08/12/24 0216 5' 4 (1.626 m)     Head Circumference --      Peak Flow --      Pain Score 08/12/24 0216 5     Pain Loc --      Pain Education --      Exclude from Growth Chart --     Most recent vital signs: Vitals:   08/12/24 0348 08/12/24 0400  BP: 122/67 109/80  Pulse: 72 71  Resp: 10 14  Temp:    SpO2: 97%      General: Awake, no distress.  HEENT: Abrasion to forehead CV:  Good peripheral perfusion. RRR, RP 2+ Resp:  Normal effort. CTAB Abd:  No distention. Nontender to deep palpation  throughout Neuro:  AO x 4, cranial nerves intact, asymmetric, no focal motor deficit appreciated, does have difficulty lifting bilateral legs off stretcher but is able to do so.  EHL/FHL 5/5 bilaterally.   ED Results / Procedures / Treatments   Labs (all labs ordered are listed, but only abnormal results are displayed) Labs Reviewed  CBC WITH DIFFERENTIAL/PLATELET - Abnormal; Notable for the following components:      Result Value   WBC 17.0 (*)    Platelets 426 (*)    Neutro Abs 12.1 (*)    Monocytes Absolute 1.4 (*)    Abs Immature Granulocytes 0.10 (*)    All other components within normal limits  URINALYSIS, COMPLETE (UACMP) WITH MICROSCOPIC - Abnormal; Notable for the following components:   Color, Urine YELLOW (*)    APPearance CLEAR (*)    Hgb urine dipstick SMALL (*)    Bacteria, UA RARE (*)    All other components within normal limits  PROTIME-INR - Abnormal; Notable for the following components:   Prothrombin Time 18.2 (*)    INR  1.4 (*)    All other components within normal limits  COMPREHENSIVE METABOLIC PANEL WITH GFR - Abnormal; Notable for the following components:   Potassium 3.3 (*)    Creatinine, Ser 1.09 (*)    Calcium  7.9 (*)    Total Protein 5.6 (*)    Albumin 2.1 (*)    GFR, Estimated 53 (*)    All other components within normal limits  RESP PANEL BY RT-PCR (RSV, FLU A&B, COVID)  RVPGX2  LIPASE, BLOOD     EKG  Ecg = sinus rhythm, rate 72, no gross ST elevation or depression, left axis deviation, no significant repolarization abnormality, leftward axis.  Mildly prolonged QTc at 506.  Similar to prior from 2022.  RADIOLOGY Radiology interpreted by myself and radiology reports reviewed.  No acute pathology identified.    PROCEDURES:  Critical Care performed: No  Procedures   MEDICATIONS ORDERED IN ED: Medications  sodium chloride  0.9 % bolus 500 mL (has no administration in time range)  meclizine  (ANTIVERT ) tablet 25 mg (has no  administration in time range)  potassium chloride  (KLOR-CON ) packet 40 mEq (40 mEq Oral Given 08/12/24 0526)     IMPRESSION / MDM / ASSESSMENT AND PLAN / ED COURSE  I reviewed the triage vital signs and the nursing notes.                              DDX/MDM/AP: Differential diagnosis includes, but is not limited to, apparent mechanical fall though unclear etiology of patient's recent weakness and frequent falls.  Consider possibility of intracranial hemorrhage or skull fracture secondary to fall, given age also consider C-spine fracture.  Doubt hip fracture.  Consider underlying UTI or pneumonia, electrolyte abnormality, anemia contributing.  No clear stroke signs at this time with bilateral lower extremity weakness and no back pain, saddle anesthesia, urinary retention to suggest acute spinal cord pathology.  Consider viral syndrome including influenza or COVID-19 contributing.  Plan: - Labs  -CT head, C-spine - Chest x-ray, x-ray pelvis - Reevaluate, low threshold for admission given frequent falls and lives alone  Patient's presentation is most consistent with acute presentation with potential threat to life or bodily function.  The patient is on the cardiac monitor to evaluate for evidence of arrhythmia and/or significant heart rate changes.  ED course below.  Labs with leukocytosis, equivocal UA.  No traumatic injuries though did incidentally find lucencies in the left femur but unclear etiology.  Patient was able to ambulate here with heavy assistance and noted that she started becoming very dizzy, treating with fluids and meclizine .  Given recurrent falls and a 24-hour timeframe, ongoing dizziness I do think patient would benefit from further evaluation as an inpatient-admitted to hospital service for further evaluation and treatment.    Clinical Course as of 08/12/24 0538  Sun Aug 12, 2024  0319 CBC with leukocytosis 17, otherwise unremarkable [MM]  0330 X-ray pelvis reviewed, no  acute pathology on my interpretation, radiology report noticing lucencies in left femur  IMPRESSION: Lucencies in the proximal left femur. No acute fracture is noted. These may represent metastatic lesions.   [MM]  0330 Chest x-ray unremarkable on my interpretation, radiology report below  IMPRESSION: No acute abnormality noted.   [MM]  0349 CT head and C-spine interpreted by myself, no acute pathology identified, radiology report reviewed  IMPRESSION: CT of the head: No acute intracranial abnormality noted.  CT of the cervical spine: Multilevel degenerative change  without acute bony abnormality.  Air-fluid level in the left maxillary antrum of uncertain chronicity.   [MM]  0407 UA equivocal for infxn  Viral swab neg [MM]  0458 CMP with mild hypokalemia, otherwise unremarkable [MM]  0520 Patient ambulated but with significant dizziness.  Is now stating that she is too dizzy to get up off of the commode.  Given her recurrent falls and ongoing dizziness, I do believe this elderly patient lives alone and is on blood thinners would benefit from further evaluation in the hospital.  Hospitalist consult order placed. [MM]    Clinical Course User Index [MM] Clarine Ozell LABOR, MD     FINAL CLINICAL IMPRESSION(S) / ED DIAGNOSES   Final diagnoses:  Recurrent falls  Facial hematoma, initial encounter  Dizziness  Abnormal x-ray of femur     Rx / DC Orders   ED Discharge Orders     None        Note:  This document was prepared using Dragon voice recognition software and may include unintentional dictation errors.   Clarine Ozell LABOR, MD 08/12/24 [phone]

## 2024-08-12 NOTE — ED Notes (Signed)
 While sitting on the toilet pt stated that she was dizzy and had white spots. Provider notified .

## 2024-08-13 ENCOUNTER — Observation Stay

## 2024-08-13 DIAGNOSIS — S0083XA Contusion of other part of head, initial encounter: Secondary | ICD-10-CM | POA: Diagnosis not present

## 2024-08-13 DIAGNOSIS — R531 Weakness: Secondary | ICD-10-CM | POA: Diagnosis not present

## 2024-08-13 LAB — RESPIRATORY PANEL BY PCR

## 2024-08-13 LAB — CBC
HCT: 34.4 % — ABNORMAL LOW (ref 36.0–46.0)
Hemoglobin: 11 g/dL — ABNORMAL LOW (ref 12.0–15.0)
MCH: 30.7 pg (ref 26.0–34.0)
MCHC: 32 g/dL (ref 30.0–36.0)
MCV: 96.1 fL (ref 80.0–100.0)
Platelets: 362 K/uL (ref 150–400)
RBC: 3.58 MIL/uL — ABNORMAL LOW (ref 3.87–5.11)
RDW: 14.7 % (ref 11.5–15.5)
WBC: 15 K/uL — ABNORMAL HIGH (ref 4.0–10.5)
nRBC: 0 % (ref 0.0–0.2)

## 2024-08-13 LAB — BASIC METABOLIC PANEL WITH GFR
Anion gap: 7 (ref 5–15)
BUN: 10 mg/dL (ref 8–23)
CO2: 29 mmol/L (ref 22–32)
Calcium: 7.6 mg/dL — ABNORMAL LOW (ref 8.9–10.3)
Chloride: 104 mmol/L (ref 98–111)
Creatinine, Ser: 1.03 mg/dL — ABNORMAL HIGH (ref 0.44–1.00)
GFR, Estimated: 57 mL/min — ABNORMAL LOW (ref >60)
Glucose, Bld: 74 mg/dL (ref 70–99)
Potassium: 3.2 mmol/L — ABNORMAL LOW (ref 3.5–5.1)
Sodium: 140 mmol/L (ref 135–145)

## 2024-08-13 LAB — URINE CULTURE: Culture: NO GROWTH

## 2024-08-13 NOTE — Care Management Obs Status (Signed)
 MEDICARE OBSERVATION STATUS NOTIFICATION   Patient Details  Name: Sophia Andrews MRN: [id number] Date of Birth: 03-07-1951   Medicare Observation Status Notification Given:  Yes Code 44 completed and given to pt    Alfonso Rummer, LCSW 08/13/2024, 1:10 PM

## 2024-08-13 NOTE — Care Management CC44 (Addendum)
 Condition Code 44 Documentation Completed  Patient Details  Name: Sophia Andrews MRN: [id number] Date of Birth: Apr 04, 1951   Condition Code 44 given:  Brown Fent  Patient signature on Condition Code 44 notice:  yesYes Documentation of 2 MD's agreement:  Yes Code 44 added to claim:  Yes    Alfonso Rummer, LCSW 08/13/2024, 4:13 PM

## 2024-08-13 NOTE — Evaluation (Signed)
 Occupational Therapy Evaluation Patient Details Name: Sophia Andrews MRN: [id number] DOB: August 14, 1951 Today's Date: 08/13/2024   History of Present Illness   Pt is a 73 year old female presented due to falls at home    PMH significant for Chronic HFrEF, nonischemic cardiomyopathy, HTN, paroxysmal A-fib on Eliquis , CKD, COPD, chronic gout, recurrent MDD, OSA     Clinical Impressions Chart reviewed to date, pt greeted in beside chair, alert and oriented x4, ?problem solving deficits, will continue to assess. PTA Pt reports she requires assist for LB dressing, bathing, toileting with aid 3 hrs per day 6 days a week; She also has assist with IADL from aid/family and amb with rollator. Pt presents with deficits in strength, endurance, activity tolerance, balance, cognition, affecting safe and optimal ADL completion. Pt requires supervision-CGA for multiple STS attempts, amb in room approx 30' with MIN A, progress to CGA.SET up for grooming/feeding tasks, and MAX A for toileting after continent BM. Pt is performing ADL below PLOF, will benefit from acute OT to address functional deficits and to facilitate optimal ADL performance. Pt is left in bedside chair, all needs met. OT will follow.      If plan is discharge home, recommend the following:   A little help with walking and/or transfers;A little help with bathing/dressing/bathroom;Direct supervision/assist for medications management;Direct supervision/assist for financial management     Functional Status Assessment   Patient has had a recent decline in their functional status and demonstrates the ability to make significant improvements in function in a reasonable and predictable amount of time.     Equipment Recommendations   None recommended by OT     Recommendations for Other Services         Precautions/Restrictions   Precautions Precautions: Fall Recall of Precautions/Restrictions: Intact     Mobility Bed Mobility                General bed mobility comments: NT in recliner pre/post session    Transfers Overall transfer level: Needs assistance Equipment used: Rolling walker (2 wheels) Transfers: Sit to/from Stand Sit to Stand: Contact guard assist (3 attempts- 2 from bedside chair, one from bsc)                  Balance Overall balance assessment: Needs assistance Sitting-balance support: Bilateral upper extremity supported Sitting balance-Leahy Scale: Good     Standing balance support: Bilateral upper extremity supported, Reliant on assistive device for balance, During functional activity Standing balance-Leahy Scale: Fair                             ADL either performed or assessed with clinical judgement   ADL Overall ADL's : Needs assistance/impaired Eating/Feeding: Set up;Sitting   Grooming: Set up;Standing               Lower Body Dressing: Moderate assistance;Sitting/lateral leans   Toilet Transfer: Contact guard assist;Rolling walker (2 wheels);Minimal assistance;BSC/3in1 Statistician Details (indicate cue type and reason): short amb transfer to frequency/urgency Toileting- Clothing Manipulation and Hygiene: Maximal assistance Toileting - Clothing Manipulation Details (indicate cue type and reason): for thoroughness after BM     Functional mobility during ADLs: Contact guard assist;Minimal assistance;Rolling walker (2 wheels) (approx 30' in room)       Vision Patient Visual Report: No change from baseline       Perception         Praxis  Pertinent Vitals/Pain Pain Assessment Pain Assessment: No/denies pain     Extremity/Trunk Assessment Upper Extremity Assessment Upper Extremity Assessment: Overall WFL for tasks assessed   Lower Extremity Assessment Lower Extremity Assessment: Generalized weakness       Communication Communication Communication: No apparent difficulties   Cognition Arousal: Alert Behavior During  Therapy: WFL for tasks assessed/performed Cognition: No family/caregiver present to determine baseline, History of cognitive impairments, Cognition impaired       Memory impairment (select all impairments): Declarative long-term memory Attention impairment (select first level of impairment): Selective attention Executive functioning impairment (select all impairments): Reasoning, Problem solving                   Following commands: Intact       Cueing  General Comments   Cueing Techniques: Verbal cues  vss throughout, no c/o nausea/dizziness with mobility attempts   Exercises Other Exercises Other Exercises: edu re role of OT, role of rehab, discussed discharge recommendations and safe ADL completion   Shoulder Instructions      Home Living Family/patient expects to be discharged to:: Private residence Living Arrangements: Alone Available Help at Discharge: Family;Available PRN/intermittently (has an aid 3 hrs per day 6 days a week, will pay her aid extra to come to dr appts) Type of Home: Apartment Home Access: Level entry     Home Layout: One level     Bathroom Shower/Tub: Producer, television/film/video: Standard Bathroom Accessibility: Yes   Home Equipment: Educational psychologist (4 wheels);Lift chair          Prior Functioning/Environment Prior Level of Function : Independent/Modified Independent;History of Falls (last six months)             Mobility Comments: amb with rollator ADLs Comments: pt reports she requires assist from aid for LB dressing, bathing, toileting after BM (she reports she does her best if she is alone); MOD I for grooming/feeding; Her aid cooks/cleans, she does not drive and her children help with med management every 2 weeks per her report    OT Problem List: Decreased strength;Decreased activity tolerance;Decreased knowledge of use of DME or AE;Impaired balance (sitting and/or standing);Decreased safety awareness   OT  Treatment/Interventions: Self-care/ADL training;Energy conservation;Balance training;Therapeutic exercise;DME and/or AE instruction;Patient/family education;Therapeutic activities      OT Goals(Current goals can be found in the care plan section)   Acute Rehab OT Goals Patient Stated Goal: go home OT Goal Formulation: With patient Time For Goal Achievement: 08/27/24 Potential to Achieve Goals: Fair ADL Goals Pt Will Perform Grooming: with modified independence;sitting;standing Pt Will Perform Lower Body Dressing: with min assist Pt Will Transfer to Toilet: with modified independence;ambulating Pt Will Perform Toileting - Clothing Manipulation and hygiene: with modified independence;sit to/from stand;sitting/lateral leans   OT Frequency:  Min 3X/week    Co-evaluation              AM-PAC OT 6 Clicks Daily Activity     Outcome Measure Help from another person eating meals?: None Help from another person taking care of personal grooming?: None Help from another person toileting, which includes using toliet, bedpan, or urinal?: A Lot Help from another person bathing (including washing, rinsing, drying)?: A Little Help from another person to put on and taking off regular upper body clothing?: None Help from another person to put on and taking off regular lower body clothing?: A Little 6 Click Score: 20   End of Session Equipment Utilized During Treatment: Rolling walker (2 wheels)  Nurse Communication: Mobility status  Activity Tolerance: Patient tolerated treatment well Patient left: in chair;with call bell/phone within reach;with chair alarm set  OT Visit Diagnosis: Other abnormalities of gait and mobility (R26.89);Muscle weakness (generalized) (M62.81)                Time: 9045-8982 OT Time Calculation (min): 23 min Charges:  OT General Charges $OT Visit: 1 Visit OT Evaluation $OT Eval Moderate Complexity: 1 Mod  Therisa Sheffield, OTD OTR/L  08/13/24, 10:39 AM

## 2024-08-13 NOTE — Progress Notes (Signed)
  Device system confirmed to be MRI conditional, with implant date > 6 weeks ago, and no evidence of abandoned or epicardial leads in review of most recent CXR  Device last cleared by EP Provider: Suzann Riddle 08/13/2024    Clearance is good through for 1 year as long as parameters remain stable at time of check. If pt undergoes a cardiac device procedure during that time, they should be re-cleared.   Tachy-therapies to be programmed off if applicable with device back to pre-MRI settings after completion of exam.  [employer] was available remotely to assist in programming recommendations.   Sophia Andrews, [place]  08/13/2024 8:18 AM

## 2024-08-13 NOTE — Discharge Summary (Signed)
 Sophia Andrews FMW:[id number] DOB: [birth year] DOA: 08/12/2024  PCP: Barzin, Sophia Homayoun, DO  Admit date: 08/12/2024 Discharge date: 08/13/2024  Time spent: 35 minutes  Recommendations for Outpatient Follow-up:  Close follow-up with the illustrious Dr. Tolbert Pursue MRI of left femur to evaluate lucencies seen on x-ray    Discharge Diagnoses:  Principal Problem:   Weakness Active Problems:   Chronic combined systolic (congestive) and diastolic (congestive) heart failure (HCC)   COPD (chronic obstructive pulmonary disease) (HCC)   HTN (hypertension)   OSA (obstructive sleep apnea)   AF (paroxysmal atrial fibrillation) (HCC)   Dilated cardiomyopathy (HCC)   AICD (automatic cardioverter/defibrillator) present   Chronic kidney disease, stage 3a (HCC)   Hypothyroidism   Malignant neoplasm of breast (female) (HCC)   Obesity   Type 2 diabetes mellitus with renal complication (HCC)   Discharge Condition: stable  Diet recommendation: heart healthy  Filed Weights   08/12/24 0216  Weight: 104.3 kg    History of present illness:  From admission h and p Sophia Andrews is a 73 y.o. female with medical history significant of Chronic HFrEF, nonischemic cardiomyopathy, HTN, paroxysmal A-fib on Eliquis , CKD, COPD, chronic gout, recurrent MDD, OSA presented due to falls at home.  Patient reports walking to the bathroom, had a fall and hit her head, denies prodromal symptoms, LOC, nausea/vomiting.  Reports having 2 episodes in the last 48 hours.  Also reports her legs feel heavier than usual and dizziness.  No falls prior to this episode, ambulates with walker Denies fever, chills, cough, SOB, chest pain, dysuria, abd pain.  2 episodes of BM today.  Reports recent history of recurrent UTIs, was started on unspecified antibiotic for UTI prevention about 3 days ago    Hospital Course:  Patient presents after fall at home. No syncope or focal neurologic symptoms, says got up to ambulate  with her walker, her feet wouldn't work like they normally do and fell hitting head, no LOC. No other pain. Was evaluated here with labs and CT of head and neck, both negative, as well as pelvic x-ray which shows non-specific lucencies in left femur. Offered patient MRI here but she elects to discharge and obtain as outpatient, offered to order through our health system but patient prefers to obtain at [facility] and will thus contact her PCP for that order. Evaluated by PT here who recommended skilled nursing, but patient feels she is at her baseline and emphatically declines offer to pursue SNF. Labs are significant for leukocytosis but ua is clear, blood cultures ngtd, covid neg, cxr clear, and patient denies infectious symptoms, so at this time think infection unlikely, advise monitoring symptoms, consider repeat CBC at pcp f/u.    Procedures: none   Consultations: none  Discharge Exam: Vitals:   08/13/24 0747 08/13/24 1153  BP: (!) 100/44 (!) 113/54  Pulse: 72 78  Resp: 16 17  Temp: 98.8 F (37.1 C) 97.7 F (36.5 C)  SpO2: 91% 96%    General: NAD Cardiovascular: RRR, soft systolic murmur Respiratory: CTAB save for rales at bases Neuro: non-focal Ext: trace LE edema  Discharge Instructions   Discharge Instructions     Diet - low sodium heart healthy   Complete by: As directed    Increase activity slowly   Complete by: As directed       Allergies as of 08/13/2024       Reactions   Sulfa Antibiotics Rash, Shortness Of Breath   Ambien [zolpidem Tartrate]  Buspirone Other (See Comments)   Confusion and involuntary movements   Empagliflozin Other (See Comments)   Confusion and UTIs UTIs   Lyrica [pregabalin]    Tetanus Toxoid-containing Vaccines    Trazodone  Other (See Comments)   Confusion and involuntary movements   Zyrtec [cetirizine] Other (See Comments)   Confusion and involuntary movements        Medication List     STOP taking these medications     acetaminophen  650 MG CR tablet Commonly known as: TYLENOL    amoxicillin -clavulanate 875-125 MG tablet Commonly known as: AUGMENTIN    busPIRone 10 MG tablet Commonly known as: BUSPAR   cetirizine 10 MG tablet Commonly known as: ZYRTEC   Mucinex 600 MG 12 hr tablet Generic drug: guaiFENesin   nitrofurantoin 100 MG capsule Commonly known as: MACRODANTIN   One-A-Day Womens 50 Plus Tabs   predniSONE  10 MG tablet Commonly known as: DELTASONE    Premarin vaginal cream Generic drug: conjugated estrogens   Probiotic (Lactobacillus) Caps   spironolactone  25 MG tablet Commonly known as: ALDACTONE    Stiolto Respimat 2.5-2.5 MCG/ACT Aers Generic drug: Tiotropium Bromide -Olodaterol   traZODone  50 MG tablet Commonly known as: DESYREL    Wegovy 0.25 MG/0.5ML Soaj SQ injection Generic drug: semaglutide-weight management       TAKE these medications    albuterol  108 (90 Base) MCG/ACT inhaler Commonly known as: Ventolin  HFA Inhale 2 puffs into the lungs every 6 (six) hours as needed for shortness of breath or wheezing.   allopurinol  100 MG tablet Commonly known as: ZYLOPRIM  Take 100 mg by mouth 2 (two) times daily.   amiodarone  200 MG tablet Commonly known as: PACERONE  Take 1 tablet (200 mg total) by mouth 2 (two) times daily for 23 days. What changed: when to take this   atorvastatin  40 MG tablet Commonly known as: LIPITOR Take 40 mg by mouth daily.   cephALEXin 250 MG capsule Commonly known as: KEFLEX Take 250 mg by mouth daily.   colchicine 0.6 MG tablet Take 0.3 mg by mouth every other day.   DULoxetine  60 MG capsule Commonly known as: CYMBALTA  Take 60 mg by mouth daily.   Eliquis  5 MG Tabs tablet Generic drug: apixaban  Take 1 tablet by mouth 2 (two) times daily.   furosemide  20 MG tablet Commonly known as: LASIX  Take 20 mg by mouth.   gabapentin  400 MG capsule Commonly known as: NEURONTIN  Take 400 mg by mouth 2 (two) times daily.    HYDROcodone -acetaminophen  5-325 MG tablet Commonly known as: NORCO/VICODIN Take 1 tablet by mouth 2 (two) times daily as needed for pain.   ipratropium-albuterol  0.5-2.5 (3) MG/3ML Soln Commonly known as: DUONEB Inhale 3 mLs into the lungs every 6 (six) hours as needed.   levothyroxine  100 MCG tablet Commonly known as: SYNTHROID  Take 100 mcg by mouth daily before breakfast.   magnesium  oxide 400 MG tablet Commonly known as: MAG-OX Take 400 mg by mouth daily.   metoprolol  succinate 25 MG 24 hr tablet Commonly known as: TOPROL -XL Take 25 mg by mouth daily.   naphazoline-glycerin 0.012-0.2 % Soln Commonly known as: CLEAR EYES REDNESS 1-2 drops 4 (four) times daily as needed for eye irritation.   sacubitril -valsartan  24-26 MG Commonly known as: ENTRESTO  Take 1 tablet by mouth 2 (two) times daily.   umeclidinium-vilanterol 62.5-25 MCG/ACT Aepb Commonly known as: ANORO ELLIPTA  Inhale 1 puff into the lungs daily.       Allergies  Allergen Reactions   Sulfa Antibiotics Rash and Shortness Of Breath  Ambien [Zolpidem Tartrate]    Buspirone Other (See Comments)    Confusion and involuntary movements   Empagliflozin Other (See Comments)    Confusion and UTIs  UTIs   Lyrica [Pregabalin]    Tetanus Toxoid-Containing Vaccines    Trazodone  Other (See Comments)    Confusion and involuntary movements   Zyrtec [Cetirizine] Other (See Comments)    Confusion and involuntary movements    Follow-up Information     Barzin, Sophia Homayoun, DO Follow up.   Specialty: Family Medicine Why: hospital follow up Contact information: [id number] [facility] [address] [phone]                  The results of significant diagnostics from this hospitalization (including imaging, microbiology, ancillary and laboratory) are listed below for reference.    Significant Diagnostic Studies: CT HEAD WO CONTRAST ( ) Result Date:  08/12/2024 CLINICAL DATA:  Recent fall with headaches and neck pain, initial encounter EXAM: CT HEAD WITHOUT CONTRAST CT CERVICAL SPINE WITHOUT CONTRAST TECHNIQUE: Multidetector CT imaging of the head and cervical spine was performed following the standard protocol without intravenous contrast. Multiplanar CT image reconstructions of the cervical spine were also generated. RADIATION DOSE REDUCTION: This exam was performed according to the departmental dose-optimization program which includes automated exposure control, adjustment of the mA and/or kV according to patient size and/or use of iterative reconstruction technique. COMPARISON:  None Available. FINDINGS: CT HEAD FINDINGS Brain: No evidence of acute infarction, hemorrhage, hydrocephalus, extra-axial collection or mass lesion/mass effect. Mild atrophic changes are noted. Vascular: No hyperdense vessel or unexpected calcification. Skull: Normal. Negative for fracture or focal lesion. Sinuses/Orbits: No acute finding. Other: None. CT CERVICAL SPINE FINDINGS Alignment: Within normal limits. Skull base and vertebrae: 7 cervical segments are well visualized. Vertebral body height is well maintained. Multilevel facet hypertrophic changes and osteophytic changes are seen. No acute fracture or acute facet abnormality is noted. The odontoid is within normal limits. Soft tissues and spinal canal: Surrounding soft tissue structures are within normal limits. Upper chest: Visualized lung apices demonstrate right azygous lobe. Other: Air-fluid level is noted within left maxillary antrum IMPRESSION: CT of the head: No acute intracranial abnormality noted. CT of the cervical spine: Multilevel degenerative change without acute bony abnormality. Air-fluid level in the left maxillary antrum of uncertain chronicity. Electronically Signed   By: Oneil Devonshire M.D.   On: 08/12/2024 03:33   CT Cervical Spine Wo Contrast Result Date: 08/12/2024 CLINICAL DATA:  Recent fall with  headaches and neck pain, initial encounter EXAM: CT HEAD WITHOUT CONTRAST CT CERVICAL SPINE WITHOUT CONTRAST TECHNIQUE: Multidetector CT imaging of the head and cervical spine was performed following the standard protocol without intravenous contrast. Multiplanar CT image reconstructions of the cervical spine were also generated. RADIATION DOSE REDUCTION: This exam was performed according to the departmental dose-optimization program which includes automated exposure control, adjustment of the mA and/or kV according to patient size and/or use of iterative reconstruction technique. COMPARISON:  None Available. FINDINGS: CT HEAD FINDINGS Brain: No evidence of acute infarction, hemorrhage, hydrocephalus, extra-axial collection or mass lesion/mass effect. Mild atrophic changes are noted. Vascular: No hyperdense vessel or unexpected calcification. Skull: Normal. Negative for fracture or focal lesion. Sinuses/Orbits: No acute finding. Other: None. CT CERVICAL SPINE FINDINGS Alignment: Within normal limits. Skull base and vertebrae: 7 cervical segments are well visualized. Vertebral body height is well maintained. Multilevel facet hypertrophic changes and osteophytic changes are seen. No acute fracture or  acute facet abnormality is noted. The odontoid is within normal limits. Soft tissues and spinal canal: Surrounding soft tissue structures are within normal limits. Upper chest: Visualized lung apices demonstrate right azygous lobe. Other: Air-fluid level is noted within left maxillary antrum IMPRESSION: CT of the head: No acute intracranial abnormality noted. CT of the cervical spine: Multilevel degenerative change without acute bony abnormality. Air-fluid level in the left maxillary antrum of uncertain chronicity. Electronically Signed   By: Oneil Devonshire M.D.   On: 08/12/2024 03:33   DG Pelvis Portable Result Date: 08/12/2024 CLINICAL DATA:  Pelvic pain EXAM: PORTABLE PELVIS 1 VIEWS COMPARISON:  None Available.  FINDINGS: Right hip replacement is noted. Pelvic ring is intact. No acute fracture or dislocation is noted. Lucencies are noted in the proximal left femur suspicious for metastatic disease. No other focal abnormality is noted. IMPRESSION: Lucencies in the proximal left femur. No acute fracture is noted. These may represent metastatic lesions. Electronically Signed   By: Oneil Devonshire M.D.   On: 08/12/2024 03:09   DG Chest Portable 1 View Result Date: 08/12/2024 CLINICAL DATA:  Chest pain EXAM: PORTABLE CHEST 1 VIEW COMPARISON:  12/10/2021 FINDINGS: Cardiac shadow is enlarged. Defibrillator is again seen and stable. The lungs are well aerated bilaterally. No focal infiltrate or effusion is seen. As a gas lobe is again noted. No bony abnormality is seen. IMPRESSION: No acute abnormality noted. Electronically Signed   By: Oneil Devonshire M.D.   On: 08/12/2024 03:07    Microbiology: Recent Results (from the past 240 hours)  Resp panel by RT-PCR (RSV, Flu A&B, Covid) Anterior Nasal Swab     Status: None   Collection Time: 08/12/24  3:09 AM   Specimen: Anterior Nasal Swab  Result Value Ref Range Status   SARS Coronavirus 2 by RT PCR NEGATIVE NEGATIVE Final    Comment: (NOTE) SARS-CoV-2 target nucleic acids are NOT DETECTED.  The SARS-CoV-2 RNA is generally detectable in upper respiratory specimens during the acute phase of infection. The lowest concentration of SARS-CoV-2 viral copies this assay can detect is 138 copies/mL. A negative result does not preclude SARS-Cov-2 infection and should not be used as the sole basis for treatment or other patient management decisions. A negative result may occur with  improper specimen collection/handling, submission of specimen other than nasopharyngeal swab, presence of viral mutation(s) within the areas targeted by this assay, and inadequate number of viral copies(<138 copies/mL). A negative result must be combined with clinical observations, patient history,  and epidemiological information. The expected result is Negative.  Fact Sheet for Patients:  BloggerCourse.com  Fact Sheet for Healthcare Providers:  SeriousBroker.it  This test is no t yet approved or cleared by the [place]  FDA and  has been authorized for detection and/or diagnosis of SARS-CoV-2 by FDA under an Emergency Use Authorization (EUA). This EUA will remain  in effect (meaning this test can be used) for the duration of the COVID-19 declaration under Section 564(b)(1) of the Act, 21 U.S.C.section 360bbb-3(b)(1), unless the authorization is terminated  or revoked sooner.       Influenza A by PCR NEGATIVE NEGATIVE Final   Influenza B by PCR NEGATIVE NEGATIVE Final    Comment: (NOTE) The Xpert Xpress SARS-CoV-2/FLU/RSV plus assay is intended as an aid in the diagnosis of influenza from Nasopharyngeal swab specimens and should not be used as a sole basis for treatment. Nasal washings and aspirates are unacceptable for Xpert Xpress SARS-CoV-2/FLU/RSV testing.  Fact Sheet for Patients: BloggerCourse.com  Fact Sheet for Healthcare Providers: SeriousBroker.it  This test is not yet approved or cleared by the [place]  FDA and has been authorized for detection and/or diagnosis of SARS-CoV-2 by FDA under an Emergency Use Authorization (EUA). This EUA will remain in effect (meaning this test can be used) for the duration of the COVID-19 declaration under Section 564(b)(1) of the Act, 21 U.S.C. section 360bbb-3(b)(1), unless the authorization is terminated or revoked.     Resp Syncytial Virus by PCR NEGATIVE NEGATIVE Final    Comment: (NOTE) Fact Sheet for Patients: BloggerCourse.com  Fact Sheet for Healthcare Providers: SeriousBroker.it  This test is not yet approved or cleared by the [place]  FDA and has  been authorized for detection and/or diagnosis of SARS-CoV-2 by FDA under an Emergency Use Authorization (EUA). This EUA will remain in effect (meaning this test can be used) for the duration of the COVID-19 declaration under Section 564(b)(1) of the Act, 21 U.S.C. section 360bbb-3(b)(1), unless the authorization is terminated or revoked.  Performed at [facility], [address] [place], [place] [address]   Culture, blood (Routine X 2) w Reflex to ID Panel     Status: None (Preliminary result)   Collection Time: 08/12/24  3:24 PM   Specimen: BLOOD  Result Value Ref Range Status   Specimen Description BLOOD BLOOD RIGHT HAND  Final   Special Requests   Final    BOTTLES DRAWN AEROBIC AND ANAEROBIC Blood Culture adequate volume   Culture   Final    NO GROWTH < 24 HOURS Performed at [facility], [address] [place], [place] [address]    Report Status PENDING  Incomplete  Respiratory (~20 pathogens) panel by PCR     Status: None   Collection Time: 08/12/24 11:20 PM   Specimen: Nasopharyngeal Swab; Respiratory  Result Value Ref Range Status   Adenovirus NOT DETECTED NOT DETECTED Final   Coronavirus 229E NOT DETECTED NOT DETECTED Final    Comment: (NOTE) The Coronavirus on the Respiratory Panel, DOES NOT test for the novel  Coronavirus (2019 nCoV)    Coronavirus HKU1 NOT DETECTED NOT DETECTED Final   Coronavirus NL63 NOT DETECTED NOT DETECTED Final   Coronavirus OC43 NOT DETECTED NOT DETECTED Final   Metapneumovirus NOT DETECTED NOT DETECTED Final   Rhinovirus / Enterovirus NOT DETECTED NOT DETECTED Final   Influenza A NOT DETECTED NOT DETECTED Final   Influenza B NOT DETECTED NOT DETECTED Final   Parainfluenza Virus 1 NOT DETECTED NOT DETECTED Final   Parainfluenza Virus 2 NOT DETECTED NOT DETECTED Final   Parainfluenza Virus 3 NOT DETECTED NOT DETECTED Final   Parainfluenza Virus 4 NOT DETECTED NOT DETECTED Final   Respiratory Syncytial Virus NOT  DETECTED NOT DETECTED Final   Bordetella pertussis NOT DETECTED NOT DETECTED Final   Bordetella Parapertussis NOT DETECTED NOT DETECTED Final   Chlamydophila pneumoniae NOT DETECTED NOT DETECTED Final   Mycoplasma pneumoniae NOT DETECTED NOT DETECTED Final    Comment: Performed at [facility], [address], [place], [place] [address]     Labs: Basic Metabolic Panel: Recent Labs  Lab 08/12/24 0345 08/13/24 0358  NA 136 140  K 3.3* 3.2*  CL 98 104  CO2 28 29  GLUCOSE 77 74  BUN 10 10  CREATININE 1.09* 1.03*  CALCIUM  7.9* 7.6*  MG 1.8  --    Liver Function Tests: Recent Labs  Lab 08/12/24 0345  AST 35  ALT 14  ALKPHOS 71  BILITOT  0.8  PROT 5.6*  ALBUMIN 2.1*   Recent Labs  Lab 08/12/24 0345  LIPASE 19   No results for input(s): AMMONIA in the last 168 hours. CBC: Recent Labs  Lab 08/12/24 0232 08/13/24 0358  WBC 17.0* 15.0*  NEUTROABS 12.1*  --   HGB 12.3 11.0*  HCT 39.4 34.4*  MCV 97.5 96.1  PLT 426* 362   Cardiac Enzymes: No results for input(s): CKTOTAL, CKMB, CKMBINDEX, TROPONINI in the last 168 hours. BNP: BNP (last 3 results) No results for input(s): BNP in the last 8760 hours.  ProBNP (last 3 results) No results for input(s): PROBNP in the last 8760 hours.  CBG: No results for input(s): GLUCAP in the last 168 hours.     Signed:  Devaughn KATHEE Ban MD.  [facility] 08/13/2024, 12:27 PM

## 2024-08-13 NOTE — Plan of Care (Signed)

## 2024-08-17 LAB — CULTURE, BLOOD (ROUTINE X 2)
Culture: NO GROWTH
Special Requests: ADEQUATE
